# Patient Record
Sex: Female | Born: 1975 | Race: Black or African American | Hispanic: No | Marital: Single | Smoking: Never smoker
Health system: Southern US, Community
[De-identification: ages and names within clinical notes are randomized; demographics above are authoritative.]

## PROBLEM LIST (undated history)

## (undated) ENCOUNTER — Emergency Department: Admission: EM | Discharge status: Absent without leave | Payer: No Typology Code available for payment source

## (undated) DIAGNOSIS — J45909 Unspecified asthma, uncomplicated: Secondary | ICD-10-CM

## (undated) DIAGNOSIS — C801 Malignant (primary) neoplasm, unspecified: Secondary | ICD-10-CM

## (undated) HISTORY — PX: PORTACATH PLACEMENT: SHX2246

## (undated) HISTORY — PX: BREAST SURGERY: SHX581

---

## 2010-09-16 ENCOUNTER — Emergency Department (HOSPITAL_BASED_OUTPATIENT_CLINIC_OR_DEPARTMENT_OTHER)
Admission: EM | Admit: 2010-09-16 | Discharge: 2010-09-16 | Disposition: A | Discharge status: Home / Self Care | Payer: Self-pay | Attending: Emergency Medicine | Admitting: Emergency Medicine

## 2010-09-16 DIAGNOSIS — J329 Chronic sinusitis, unspecified: Secondary | ICD-10-CM | POA: Insufficient documentation

## 2010-09-16 DIAGNOSIS — J3489 Other specified disorders of nose and nasal sinuses: Secondary | ICD-10-CM | POA: Insufficient documentation

## 2011-10-17 ENCOUNTER — Emergency Department (HOSPITAL_BASED_OUTPATIENT_CLINIC_OR_DEPARTMENT_OTHER)
Admission: EM | Admit: 2011-10-17 | Discharge: 2011-10-17 | Disposition: A | Discharge status: Home / Self Care | Payer: Self-pay | Attending: Emergency Medicine | Admitting: Emergency Medicine

## 2011-10-17 ENCOUNTER — Encounter (HOSPITAL_BASED_OUTPATIENT_CLINIC_OR_DEPARTMENT_OTHER): Payer: Self-pay | Admitting: *Deleted

## 2011-10-17 DIAGNOSIS — R220 Localized swelling, mass and lump, head: Secondary | ICD-10-CM | POA: Insufficient documentation

## 2011-10-17 DIAGNOSIS — D649 Anemia, unspecified: Secondary | ICD-10-CM | POA: Insufficient documentation

## 2011-10-17 DIAGNOSIS — K0889 Other specified disorders of teeth and supporting structures: Secondary | ICD-10-CM

## 2011-10-17 DIAGNOSIS — M79609 Pain in unspecified limb: Secondary | ICD-10-CM | POA: Insufficient documentation

## 2011-10-17 DIAGNOSIS — K089 Disorder of teeth and supporting structures, unspecified: Secondary | ICD-10-CM | POA: Insufficient documentation

## 2011-10-17 LAB — DIFFERENTIAL
Basophils Absolute: 0 10*3/uL (ref 0.0–0.1)
Basophils Relative: 0 % (ref 0–1)
Monocytes Absolute: 0.3 10*3/uL (ref 0.1–1.0)
Neutro Abs: 4.8 10*3/uL (ref 1.7–7.7)

## 2011-10-17 LAB — CBC
HCT: 31.5 % — ABNORMAL LOW (ref 36.0–46.0)
MCHC: 34.6 g/dL (ref 30.0–36.0)
Platelets: 369 10*3/uL (ref 150–400)
RDW: 15 % (ref 11.5–15.5)

## 2011-10-17 MED ORDER — HYDROCODONE-ACETAMINOPHEN 5-325 MG PO TABS: 2.0000 | ORAL_TABLET | ORAL | Status: AC | PRN

## 2011-10-17 MED ORDER — FERROUS SULFATE 325 (65 FE) MG PO TABS: 325.0000 mg | ORAL_TABLET | Freq: Every day | ORAL | Status: DC

## 2011-10-17 MED ORDER — CLINDAMYCIN HCL 150 MG PO CAPS: 150.0000 mg | ORAL_CAPSULE | Freq: Four times a day (QID) | ORAL | Status: AC

## 2011-10-17 NOTE — ED Provider Notes (Signed)
History     CSN: [id number]  Arrival date & time 10/17/11  1122   First MD Initiated Contact with Patient 10/17/11 1236      Chief Complaint  Patient presents with  . Facial Pain  . Leg Pain    (Consider location/radiation/quality/duration/timing/severity/associated sxs/prior treatment) Patient is a 36 y.o. female presenting with tooth pain. The history is provided by the patient. No language interpreter was used.  Dental PainThe primary symptoms include mouth pain. The symptoms began 2 days ago. The symptoms are worsening. The symptoms are new. The symptoms occur constantly.  Additional symptoms include: gum swelling and gum tenderness.  Pt also reports cramping pain in left leg last night.   Pt reports she eats a lot of ice.  Pt has a history of anemia  History reviewed. No pertinent past medical history.  History reviewed. No pertinent past surgical history.  History reviewed. No pertinent family history.  History  Substance Use Topics  . Smoking status: Never Smoker   . Smokeless tobacco: Not on file  . Alcohol Use: No    OB History    Grav Para Term Preterm Abortions TAB SAB Ect Mult Living                  Review of Systems  HENT: Positive for dental problem.   All other systems reviewed and are negative.    Allergies  Penicillins  Home Medications  No current outpatient prescriptions on file.  BP 123/80  Pulse 70  Temp(Src) 98.7 F (37.1 C) (Oral)  Resp 18  SpO2 100%  LMP 10/03/2011  Physical Exam  Nursing note and vitals reviewed. Constitutional: She is oriented to person, place, and time. She appears well-developed and well-nourished.  HENT:  Head: Normocephalic and atraumatic.  Right Ear: External ear normal.  Left Ear: External ear normal.       Swollen left gumline  Eyes: Conjunctivae and EOM are normal. Pupils are equal, round, and reactive to light.  Neck: Normal range of motion. Neck supple.  Cardiovascular: Normal rate and normal  heart sounds.   Pulmonary/Chest: Effort normal.  Abdominal: Soft.  Musculoskeletal: Normal range of motion.  Neurological: She is alert and oriented to person, place, and time. She has normal reflexes.  Skin: Skin is warm.  Psychiatric: She has a normal mood and affect.    ED Course  Procedures (including critical care time)  Labs Reviewed - No data to display No results found.   No diagnosis found.    MDM  Schedule to see Dr. Leanord Asal for dental evaluation  Hydrocodone,  Clindamycin,  Iron supplement.   Pt referred to dr. Rodena Medin for anemia evaluation       Lonia Skinner [place], [place] 10/17/11 1414  Lonia Skinner [place], [place] 10/17/11 1415

## 2011-10-17 NOTE — ED Notes (Signed)
Reports sharp pain in face from upper lip to behind ear also having sharp pains going down left leg

## 2011-10-17 NOTE — ED Notes (Signed)
PO fluids provided. 

## 2011-10-17 NOTE — Discharge Instructions (Signed)
Dental Pain A tooth ache may be caused by cavities (tooth decay). Cavities expose the nerve of the tooth to air and hot or cold temperatures. It may come from an infection or abscess (also called a boil or furuncle) around your tooth. It is also often caused by dental caries (tooth decay). This causes the pain you are having. DIAGNOSIS  Your caregiver can diagnose this problem by exam. TREATMENT   If caused by an infection, it may be treated with medications which kill germs (antibiotics) and pain medications as prescribed by your caregiver. Take medications as directed.   Only take over-the-counter or prescription medicines for pain, discomfort, or fever as directed by your caregiver.   Whether the tooth ache today is caused by infection or dental disease, you should see your dentist as soon as possible for further care.  SEEK MEDICAL CARE IF: The exam and treatment you received today has been provided on an emergency basis only. This is not a substitute for complete medical or dental care. If your problem worsens or new problems (symptoms) appear, and you are unable to meet with your dentist, call or return to this location. SEEK IMMEDIATE MEDICAL CARE IF:   You have a fever.   You develop redness and swelling of your face, jaw, or neck.   You are unable to open your mouth.   You have severe pain uncontrolled by pain medicine.  MAKE SURE YOU:   Understand these instructions.   Will watch your condition.   Will get help right away if you are not doing well or get worse.  Document Released: 05/08/2005 Document Revised: 04/27/2011 Document Reviewed: 12/25/2007 [facility] Patient Information 2012 [place], [place].Anemia, Nonspecific Your exam and blood tests show you are anemic. This means your blood (hemoglobin) level is low. Normal hemoglobin values are 12 to 15 g/dL for females and 14 to 17 g/dL for males. Make a note of your hemoglobin level today. The hematocrit percent is also used to  measure anemia. A normal hematocrit is 38% to 46% in females and 42% to 49% in males. Make a note of your hematocrit level today. CAUSES  Anemia can be due to many different causes.  Excessive bleeding from periods (in women).   Intestinal bleeding.   Poor nutrition.   Kidney, thyroid, liver, and bone marrow diseases.  SYMPTOMS  Anemia can come on suddenly (acute). It can also come on slowly. Symptoms can include:  Minor weakness.   Dizziness.   Palpitations.   Shortness of breath.  Symptoms may be absent until half your hemoglobin is missing if it comes on slowly. Anemia due to acute blood loss from an injury or internal bleeding may require blood transfusion if the loss is severe. Hospital care is needed if you are anemic and there is significant continual blood loss. TREATMENT   Stool tests for blood (Hemoccult) and additional lab tests are often needed. This determines the best treatment.   Further checking on your condition and your response to treatment is very important. It often takes many weeks to correct anemia.  Depending on the cause, treatment can include:  Supplements of iron.   Vitamins B12 and folic acid.   Hormone medicines.If your anemia is due to bleeding, finding the cause of the blood loss is very important. This will help avoid further problems.  SEEK IMMEDIATE MEDICAL CARE IF:   You develop fainting, extreme weakness, shortness of breath, or chest pain.   You develop heavy vaginal bleeding.   You  develop bloody or black, tarry stools or vomit up blood.   You develop a high fever, rash, repeated vomiting, or dehydration.  Document Released: 06/15/2004 Document Revised: 04/27/2011 Document Reviewed: 03/23/2009 [facility] Patient Information 2012 [place], [place].

## 2011-10-18 NOTE — ED Provider Notes (Signed)
Medical screening examination/treatment/procedure(s) were performed by non-physician practitioner and as supervising physician I was immediately available for consultation/collaboration.   Brysen Shankman, MD 10/18/11 0702 

## 2012-03-27 ENCOUNTER — Encounter (HOSPITAL_BASED_OUTPATIENT_CLINIC_OR_DEPARTMENT_OTHER): Payer: Self-pay | Admitting: Family Medicine

## 2012-03-27 ENCOUNTER — Emergency Department (HOSPITAL_BASED_OUTPATIENT_CLINIC_OR_DEPARTMENT_OTHER)
Admission: EM | Admit: 2012-03-27 | Discharge: 2012-03-27 | Disposition: A | Discharge status: Home / Self Care | Payer: Self-pay | Attending: Emergency Medicine | Admitting: Emergency Medicine

## 2012-03-27 DIAGNOSIS — J45909 Unspecified asthma, uncomplicated: Secondary | ICD-10-CM | POA: Insufficient documentation

## 2012-03-27 DIAGNOSIS — K029 Dental caries, unspecified: Secondary | ICD-10-CM | POA: Insufficient documentation

## 2012-03-27 DIAGNOSIS — Z79899 Other long term (current) drug therapy: Secondary | ICD-10-CM | POA: Insufficient documentation

## 2012-03-27 HISTORY — DX: Unspecified asthma, uncomplicated: J45.909

## 2012-03-27 MED ORDER — CLINDAMYCIN HCL 150 MG PO CAPS: 150.0000 mg | ORAL_CAPSULE | Freq: Four times a day (QID) | ORAL | Status: DC

## 2012-03-27 MED ORDER — IBUPROFEN 800 MG PO TABS
800.0000 mg | ORAL_TABLET | Freq: Once | ORAL | Status: AC
  Administered 2012-03-27: 800 mg via ORAL
  Filled 2012-03-27: qty 1

## 2012-03-27 NOTE — ED Notes (Signed)
Pt c/o left upper tooth pain x 2 wks and c/o left sided facial swelling since yesterday. Pt denies fever, chills

## 2012-03-27 NOTE — ED Provider Notes (Signed)
History     CSN: [id number]  Arrival date & time 03/27/12  4098   First MD Initiated Contact with Patient 03/27/12 1010      Chief Complaint  Patient presents with  . Dental Pain    (Consider location/radiation/quality/duration/timing/severity/associated sxs/prior treatment) HPI Patient complaining of swelling in left cheek. She has noticed some left upper tooth pain for 2 weeks but states it only is painful when she opened her mouth very wide. She has had some problems with her teeth in the past. She tried to make an appointment with the dentist today but cannot be seen for 2 weeks. She has not had any fever or chills. She has not had any new injury to her teeth. She takes Aleve with relief of pain. Past Medical History  Diagnosis Date  . Asthma     Past Surgical History  Procedure Date  . Cesarean section     No family history on file.  History  Substance Use Topics  . Smoking status: Never Smoker   . Smokeless tobacco: Not on file  . Alcohol Use: No    OB History    Grav Para Term Preterm Abortions TAB SAB Ect Mult Living                  Review of Systems  Constitutional: Negative.   HENT: Positive for facial swelling.   Respiratory: Negative.   Cardiovascular: Negative.   Genitourinary: Negative.     Allergies  Penicillins  Home Medications   Current Outpatient Rx  Name  Route  Sig  Dispense  Refill  . NAPROXEN SODIUM 220 MG PO TABS   Oral   Take 220 mg by mouth 2 (two) times daily with a meal.         . FERROUS SULFATE 325 (65 FE) MG PO TABS   Oral   Take 1 tablet (325 mg total) by mouth daily.   30 tablet   0     BP 102/70  Pulse 81  Temp 99.3 F (37.4 C) (Oral)  Resp 18  Ht 5\' 2"  (1.575 m)  Wt 125 lb (56.7 kg)  BMI 22.86 kg/m2  SpO2 99%  LMP 03/12/2012  Physical Exam  Nursing note and vitals reviewed. Constitutional: She is oriented to person, place, and time. She appears well-developed and well-nourished.  HENT:  Head:  Normocephalic and atraumatic.  Right Ear: External ear normal.  Left Ear: External ear normal.  Mouth/Throat: Oropharynx is clear and moist.       Dental caries left upper second molar with no tenderness around the gum or swelling of the gum noted.  Eyes: Pupils are equal, round, and reactive to light.  Neck: Normal range of motion.  Cardiovascular: Normal rate.   Pulmonary/Chest: Effort normal.  Abdominal: Soft.  Musculoskeletal: Normal range of motion.  Neurological: She is alert and oriented to person, place, and time.  Skin: Skin is warm and dry.  Psychiatric: She has a normal mood and affect.    ED Course  Procedures (including critical care time)  Labs Reviewed - No data to display No results found.   No diagnosis found.    MDM  Patient is allergic to penicillin and will be placed on clindamycin. She'll continue to use nonsteroidals for pain. She's advised followup with a dentist as soon as possible.        Hilario Quarry, MD 03/27/12 1016

## 2012-06-23 ENCOUNTER — Encounter (HOSPITAL_BASED_OUTPATIENT_CLINIC_OR_DEPARTMENT_OTHER): Payer: Self-pay | Admitting: *Deleted

## 2012-06-23 ENCOUNTER — Emergency Department (HOSPITAL_BASED_OUTPATIENT_CLINIC_OR_DEPARTMENT_OTHER)
Admission: EM | Admit: 2012-06-23 | Discharge: 2012-06-23 | Disposition: A | Discharge status: Home / Self Care | Payer: Self-pay | Attending: Emergency Medicine | Admitting: Emergency Medicine

## 2012-06-23 DIAGNOSIS — R509 Fever, unspecified: Secondary | ICD-10-CM | POA: Insufficient documentation

## 2012-06-23 DIAGNOSIS — J45909 Unspecified asthma, uncomplicated: Secondary | ICD-10-CM | POA: Insufficient documentation

## 2012-06-23 DIAGNOSIS — L03317 Cellulitis of buttock: Secondary | ICD-10-CM

## 2012-06-23 DIAGNOSIS — L0231 Cutaneous abscess of buttock: Secondary | ICD-10-CM | POA: Insufficient documentation

## 2012-06-23 DIAGNOSIS — Z79899 Other long term (current) drug therapy: Secondary | ICD-10-CM | POA: Insufficient documentation

## 2012-06-23 DIAGNOSIS — Z3202 Encounter for pregnancy test, result negative: Secondary | ICD-10-CM | POA: Insufficient documentation

## 2012-06-23 LAB — URINALYSIS, ROUTINE W REFLEX MICROSCOPIC
Bilirubin Urine: NEGATIVE
Ketones, ur: NEGATIVE mg/dL
Nitrite: NEGATIVE
Specific Gravity, Urine: 1.004 — ABNORMAL LOW (ref 1.005–1.030)
Urobilinogen, UA: 1 mg/dL (ref 0.0–1.0)

## 2012-06-23 LAB — URINE MICROSCOPIC-ADD ON

## 2012-06-23 MED ORDER — SULFAMETHOXAZOLE-TRIMETHOPRIM 800-160 MG PO TABS: 1.0000 | ORAL_TABLET | Freq: Two times a day (BID) | ORAL | Status: DC

## 2012-06-23 MED ORDER — CEPHALEXIN 250 MG PO CAPS
500.0000 mg | ORAL_CAPSULE | Freq: Once | ORAL | Status: AC
  Administered 2012-06-23: 500 mg via ORAL
  Filled 2012-06-23: qty 2

## 2012-06-23 MED ORDER — SULFAMETHOXAZOLE-TMP DS 800-160 MG PO TABS
1.0000 | ORAL_TABLET | Freq: Once | ORAL | Status: AC
  Administered 2012-06-23: 1 via ORAL
  Filled 2012-06-23: qty 1

## 2012-06-23 MED ORDER — CEPHALEXIN 500 MG PO CAPS: 500.0000 mg | ORAL_CAPSULE | Freq: Two times a day (BID) | ORAL | Status: DC

## 2012-06-23 MED ORDER — HYDROCODONE-ACETAMINOPHEN 5-500 MG PO TABS: 1.0000 | ORAL_TABLET | Freq: Four times a day (QID) | ORAL | Status: DC | PRN

## 2012-06-23 MED ORDER — IBUPROFEN 800 MG PO TABS
800.0000 mg | ORAL_TABLET | Freq: Once | ORAL | Status: AC
  Administered 2012-06-23: 800 mg via ORAL
  Filled 2012-06-23: qty 1

## 2012-06-23 NOTE — ED Notes (Signed)
Pt states she has a boil on her right buttocks which she noticed yesterday. Also c/o fever today.

## 2012-06-23 NOTE — ED Provider Notes (Signed)
History     CSN: [id number]  Arrival date & time 06/23/12  1943   First MD Initiated Contact with Patient 06/23/12 1946      Chief Complaint  Patient presents with  . Abscess    (Consider location/radiation/quality/duration/timing/severity/associated sxs/prior treatment) HPI Shelly Webb is a 37 y.o. female who presents to ED complaining of an abscess to right buttocks and fever. States noted abscess yesterday. Has tried to soak it, applied salt on it. No relief. States today noted fever, chills. No URI symptoms. No urinary symptoms. No abdominal pain. States "i dont really feel bad" but worried because called pharmacy and they told her she may have severe infection that could get into her blood stream. Pt took tylenol cold and flu prior to coming in.    Past Medical History  Diagnosis Date  . Asthma     Past Surgical History  Procedure Date  . Cesarean section     History reviewed. No pertinent family history.  History  Substance Use Topics  . Smoking status: Never Smoker   . Smokeless tobacco: Not on file  . Alcohol Use: No    OB History    Grav Para Term Preterm Abortions TAB SAB Ect Mult Living                  Review of Systems  Constitutional: Positive for fever and chills.  Genitourinary: Negative for dysuria and flank pain.  Skin: Positive for wound.  All other systems reviewed and are negative.    Allergies  Penicillins  Home Medications   Current Outpatient Rx  Name  Route  Sig  Dispense  Refill  . CLINDAMYCIN HCL 150 MG PO CAPS   Oral   Take 1 capsule (150 mg total) by mouth every 6 (six) hours.   28 capsule   0   . FERROUS SULFATE 325 (65 FE) MG PO TABS   Oral   Take 1 tablet (325 mg total) by mouth daily.   30 tablet   0   . NAPROXEN SODIUM 220 MG PO TABS   Oral   Take 220 mg by mouth 2 (two) times daily with a meal.           BP 123/67  Pulse 111  Temp 101.6 F (38.7 C) (Oral)  Resp 20  Ht 5\' 2"  (1.575 m)  Wt 127 lb  (57.607 kg)  BMI 23.23 kg/m2  SpO2 98%  LMP 05/23/2012  Physical Exam  Nursing note and vitals reviewed. Constitutional: She appears well-developed and well-nourished. No distress.  Eyes: Conjunctivae normal are normal.  Cardiovascular: Normal rate, regular rhythm and normal heart sounds.   Pulmonary/Chest: Effort normal and breath sounds normal. No respiratory distress. She has no wheezes. She has no rales.  Skin: Skin is warm and dry.       Abscess to the left buttock, about 3x3cm. Erythematous. Tender. Indurated. Surrounding erythema, celluitis.     ED Course  Procedures (including critical care time)  INCISION AND DRAINAGE Performed by: Jaynie Crumble A Consent: Verbal consent obtained. Risks and benefits: risks, benefits and alternatives were discussed Type: abscess  Body area: right buttock  Anesthesia: local infiltration  Incision was made with a scalpel.  Local anesthetic: lidocaine 2% wo epinephrine  Anesthetic total: 3 ml  Complexity: complex Blunt dissection to break up loculations  Drainage: purulent  Drainage amount: moderate  Packing material: 1/4 in iodoform gauze  Patient tolerance: Patient tolerated the procedure well with no immediate  complications.      1. Abscess of right buttock   2. Cellulitis of right buttock       MDM  Pt with fever, right buttock abscess and cellulitis. She is non toxic appearing. No signs of systemic illness. Abscess I&Ded. Wills tart on antibiotic, follow up in 48hrs for recheck or sooner if worsening. VS improved with motrin here. Temp down to 98.9, HR down to 82.     Filed Vitals:   06/23/12 1947 06/23/12 1950 06/23/12 2028 06/23/12 2118  BP: 123/67   109/63  Pulse: 111   82  Temp: 98.8 F (37.1 C) 101.6 F (38.7 C) 101.7 F (38.7 C) 98.9 F (37.2 C)  TempSrc: Oral Oral Oral Oral  Resp: 20   18  Height: 5\' 2"  (1.575 m)     Weight: 127 lb (57.607 kg)     SpO2: 98%   98%       Lottie Mussel, PA 06/23/12 2124

## 2012-06-23 NOTE — ED Provider Notes (Signed)
Medical screening examination/treatment/procedure(s) were performed by non-physician practitioner and as supervising physician I was immediately available for consultation/collaboration.  Shelda Jakes, MD 06/23/12 [phone]

## 2012-06-23 NOTE — ED Notes (Signed)
Patient stated first temp we got was not correct, asked for re-take. Second temp was 101.6 Patient states temp at home was over 101 and that she took tylenol before came to ED.

## 2012-06-25 ENCOUNTER — Encounter (HOSPITAL_BASED_OUTPATIENT_CLINIC_OR_DEPARTMENT_OTHER): Payer: Self-pay

## 2012-06-25 ENCOUNTER — Emergency Department (HOSPITAL_BASED_OUTPATIENT_CLINIC_OR_DEPARTMENT_OTHER)
Admission: EM | Admit: 2012-06-25 | Discharge: 2012-06-25 | Disposition: A | Discharge status: Home / Self Care | Payer: Self-pay | Attending: Emergency Medicine | Admitting: Emergency Medicine

## 2012-06-25 DIAGNOSIS — Z79899 Other long term (current) drug therapy: Secondary | ICD-10-CM | POA: Insufficient documentation

## 2012-06-25 DIAGNOSIS — L0231 Cutaneous abscess of buttock: Secondary | ICD-10-CM | POA: Insufficient documentation

## 2012-06-25 DIAGNOSIS — L0291 Cutaneous abscess, unspecified: Secondary | ICD-10-CM

## 2012-06-25 DIAGNOSIS — J45909 Unspecified asthma, uncomplicated: Secondary | ICD-10-CM | POA: Insufficient documentation

## 2012-06-25 NOTE — ED Provider Notes (Signed)
History/physical exam/procedure(s) were performed by non-physician practitioner and as supervising physician I was immediately available for consultation/collaboration. I have reviewed all notes and am in agreement with care and plan.   Hilario Quarry, MD 06/25/12 1540

## 2012-06-25 NOTE — ED Notes (Signed)
Wound recheck on right buttock.  Seen here Sunday and had an I&D.

## 2012-06-25 NOTE — ED Provider Notes (Signed)
History     CSN: [id number]  Arrival date & time 06/25/12  1149   First MD Initiated Contact with Patient 06/25/12 1213      Chief Complaint  Patient presents with  . Wound Check    (Consider location/radiation/quality/duration/timing/severity/associated sxs/prior treatment) Patient is a 37 y.o. female presenting with wound check. The history is provided by the patient.  Wound Check  She was treated in the ED 2 to 3 days ago. Previous treatment in the ED includes I&D of abscess. There has been no treatment since the wound repair. The fever has been present for less than 1 day. Her temperature was unmeasured prior to arrival. There has been colored discharge from the wound. There is no redness present. There is no swelling present. The pain has no pain. She has no difficulty moving the affected extremity or digit.    Past Medical History  Diagnosis Date  . Asthma     Past Surgical History  Procedure Date  . Cesarean section     No family history on file.  History  Substance Use Topics  . Smoking status: Never Smoker   . Smokeless tobacco: Not on file  . Alcohol Use: No    OB History    Grav Para Term Preterm Abortions TAB SAB Ect Mult Living                  Review of Systems  Skin: Positive for wound.  All other systems reviewed and are negative.    Allergies  Penicillins  Home Medications   Current Outpatient Rx  Name  Route  Sig  Dispense  Refill  . CEPHALEXIN 500 MG PO CAPS   Oral   Take 1 capsule (500 mg total) by mouth 2 (two) times daily.   14 capsule   0   . HYDROCODONE-ACETAMINOPHEN 5-500 MG PO TABS   Oral   Take 1-2 tablets by mouth every 6 (six) hours as needed for pain.   15 tablet   0   . SULFAMETHOXAZOLE-TRIMETHOPRIM 800-160 MG PO TABS   Oral   Take 1 tablet by mouth every 12 (twelve) hours.   14 tablet   0     BP 127/81  Pulse 92  Temp 98.9 F (37.2 C) (Oral)  Resp 20  SpO2 100%  LMP 05/23/2012  Physical Exam  Nursing  note and vitals reviewed. Constitutional: She appears well-developed and well-nourished.  HENT:  Head: Normocephalic.  Neck: Normal range of motion.  Pulmonary/Chest: Effort normal.  Neurological: She is alert.  Skin: Skin is warm.       Healing wound left buttock,   Packing is gone,   Slight oozing  Psychiatric: She has a normal mood and affect.    ED Course  Procedures (including critical care time)  Labs Reviewed - No data to display No results found.   1. Abscess       MDM          Elson Areas, PA 06/25/12 1329

## 2013-09-29 ENCOUNTER — Emergency Department (HOSPITAL_BASED_OUTPATIENT_CLINIC_OR_DEPARTMENT_OTHER)
Admission: EM | Admit: 2013-09-29 | Discharge: 2013-09-29 | Disposition: A | Discharge status: Home / Self Care | Payer: Self-pay | Attending: Emergency Medicine | Admitting: Emergency Medicine

## 2013-09-29 ENCOUNTER — Emergency Department (HOSPITAL_BASED_OUTPATIENT_CLINIC_OR_DEPARTMENT_OTHER): Payer: Self-pay

## 2013-09-29 ENCOUNTER — Encounter (HOSPITAL_BASED_OUTPATIENT_CLINIC_OR_DEPARTMENT_OTHER): Payer: Self-pay | Admitting: Emergency Medicine

## 2013-09-29 DIAGNOSIS — Z880 Allergy status to penicillin: Secondary | ICD-10-CM | POA: Insufficient documentation

## 2013-09-29 DIAGNOSIS — J45909 Unspecified asthma, uncomplicated: Secondary | ICD-10-CM | POA: Insufficient documentation

## 2013-09-29 DIAGNOSIS — B9789 Other viral agents as the cause of diseases classified elsewhere: Secondary | ICD-10-CM | POA: Insufficient documentation

## 2013-09-29 DIAGNOSIS — B349 Viral infection, unspecified: Secondary | ICD-10-CM

## 2013-09-29 NOTE — ED Provider Notes (Signed)
CSN: [id number]     Arrival date & time 09/29/13  1308 History   First MD Initiated Contact with Patient 09/29/13 [phone]     Chief Complaint  Patient presents with  . Fever     (Consider location/radiation/quality/duration/timing/severity/associated sxs/prior Treatment) Patient is a 38 y.o. female presenting with cough. The history is provided by the patient. No language interpreter was used.  Cough Cough characteristics:  Productive Severity:  Moderate Onset quality:  Gradual Duration:  4 days Timing:  Constant Progression:  Worsening Chronicity:  New Smoker: no   Relieved by:  Nothing Worsened by:  Activity Ineffective treatments:  Beta-agonist inhaler Associated symptoms: fever     Past Medical History  Diagnosis Date  . Asthma    Past Surgical History  Procedure Laterality Date  . Cesarean section     History reviewed. No pertinent family history. History  Substance Use Topics  . Smoking status: Never Smoker   . Smokeless tobacco: Not on file  . Alcohol Use: No   OB History   Grav Para Term Preterm Abortions TAB SAB Ect Mult Living                 Review of Systems  Constitutional: Positive for fever.  Respiratory: Positive for cough.   All other systems reviewed and are negative.     Allergies  Penicillins  Home Medications   Prior to Admission medications   Not on File   BP 115/75  Pulse 90  Temp(Src) 98.7 F (37.1 C) (Oral)  Resp 16  Ht 5\' 1"  (1.549 m)  Wt 127 lb (57.607 kg)  BMI 24.01 kg/m2  SpO2 100%  LMP 09/29/2013 Physical Exam  Nursing note and vitals reviewed. Constitutional: She is oriented to person, place, and time. She appears well-developed and well-nourished.  HENT:  Head: Normocephalic.  Right Ear: External ear normal.  Left Ear: External ear normal.  Eyes: EOM are normal. Pupils are equal, round, and reactive to light.  Neck: Normal range of motion.  Cardiovascular: Normal rate and normal heart sounds.    Pulmonary/Chest: Effort normal and breath sounds normal.  Abdominal: Soft. She exhibits no distension.  Musculoskeletal: Normal range of motion.  Neurological: She is alert and oriented to person, place, and time.  Skin: Skin is warm.  Psychiatric: She has a normal mood and affect.    ED Course  Procedures (including critical care time) Labs Review Labs Reviewed - No data to display  Imaging Review Dg Chest 2 View  09/29/2013   CLINICAL DATA:  Fever.  EXAM: CHEST  2 VIEW  COMPARISON:  None.  FINDINGS: The heart size and mediastinal contours are within normal limits. Both lungs are clear. No pneumothorax or pleural effusion is noted. The visualized skeletal structures are unremarkable.  IMPRESSION: No acute cardiopulmonary abnormality seen.   Electronically Signed   By: Sabino Dick M.D.   On: 09/29/2013 09:46     EKG Interpretation None      MDM   Final diagnoses:  Viral illness    Pt advised tylenol,  Increase fluids    Fransico Meadow, PA-C 09/29/13 1339

## 2013-09-29 NOTE — ED Notes (Signed)
Pt reports fever of 102 this morning.  Denies symptoms.  Denies body aches.  Reports 'allergies' since Thursday.  pts son was recently dx with the flu.

## 2013-09-29 NOTE — ED Provider Notes (Signed)
Medical screening examination/treatment/procedure(s) were performed by non-physician practitioner and as supervising physician I was immediately available for consultation/collaboration.   EKG Interpretation None        Blanchie Dessert, MD 09/29/13 1513

## 2013-09-29 NOTE — Discharge Instructions (Signed)
Viral Infections °A virus is a type of germ. Viruses can cause: °· Minor sore throats. °· Aches and pains. °· Headaches. °· Runny nose. °· Rashes. °· Watery eyes. °· Tiredness. °· Coughs. °· Loss of appetite. °· Feeling sick to your stomach (nausea). °· Throwing up (vomiting). °· Watery poop (diarrhea). °HOME CARE  °· Only take medicines as told by your doctor. °· Drink enough water and fluids to keep your pee (urine) clear or pale yellow. Sports drinks are a good choice. °· Get plenty of rest and eat healthy. Soups and broths with crackers or rice are fine. °GET HELP RIGHT AWAY IF:  °· You have a very bad headache. °· You have shortness of breath. °· You have chest pain or neck pain. °· You have an unusual rash. °· You cannot stop throwing up. °· You have watery poop that does not stop. °· You cannot keep fluids down. °· You or your child has a temperature by mouth above 102° F (38.9° C), not controlled by medicine. °· Your baby is older than 3 months with a rectal temperature of 102° F (38.9° C) or higher. °· Your baby is 3 months old or younger with a rectal temperature of 100.4° F (38° C) or higher. °MAKE SURE YOU:  °· Understand these instructions. °· Will watch this condition. °· Will get help right away if you are not doing well or get worse. °Document Released: 04/20/2008 Document Revised: 07/31/2011 Document Reviewed: 09/13/2010 °[employer]® Patient Information ©2014 [employer]. ° °

## 2015-02-04 ENCOUNTER — Encounter (HOSPITAL_BASED_OUTPATIENT_CLINIC_OR_DEPARTMENT_OTHER): Payer: Self-pay | Admitting: Emergency Medicine

## 2015-02-04 ENCOUNTER — Other Ambulatory Visit: Payer: Self-pay

## 2015-02-04 ENCOUNTER — Emergency Department (HOSPITAL_BASED_OUTPATIENT_CLINIC_OR_DEPARTMENT_OTHER)
Admission: EM | Admit: 2015-02-04 | Discharge: 2015-02-04 | Disposition: A | Discharge status: Home / Self Care | Payer: Self-pay | Attending: Emergency Medicine | Admitting: Emergency Medicine

## 2015-02-04 DIAGNOSIS — R143 Flatulence: Secondary | ICD-10-CM | POA: Insufficient documentation

## 2015-02-04 DIAGNOSIS — J45909 Unspecified asthma, uncomplicated: Secondary | ICD-10-CM | POA: Insufficient documentation

## 2015-02-04 DIAGNOSIS — Z79899 Other long term (current) drug therapy: Secondary | ICD-10-CM | POA: Insufficient documentation

## 2015-02-04 DIAGNOSIS — Z880 Allergy status to penicillin: Secondary | ICD-10-CM | POA: Insufficient documentation

## 2015-02-04 LAB — CBC WITH DIFFERENTIAL/PLATELET
BASOS PCT: 0 %
Basophils Absolute: 0 10*3/uL (ref 0.0–0.1)
EOS ABS: 0.1 10*3/uL (ref 0.0–0.7)
Eosinophils Relative: 1 %
HEMATOCRIT: 31.4 % — AB (ref 36.0–46.0)
Hemoglobin: 10.2 g/dL — ABNORMAL LOW (ref 12.0–15.0)
Lymphocytes Relative: 21 %
Lymphs Abs: 1.4 10*3/uL (ref 0.7–4.0)
MCH: 26.3 pg (ref 26.0–34.0)
MCHC: 32.5 g/dL (ref 30.0–36.0)
MCV: 80.9 fL (ref 78.0–100.0)
MONO ABS: 0.3 10*3/uL (ref 0.1–1.0)
MONOS PCT: 4 %
Neutro Abs: 4.7 10*3/uL (ref 1.7–7.7)
Neutrophils Relative %: 74 %
Platelets: 376 10*3/uL (ref 150–400)
RBC: 3.88 MIL/uL (ref 3.87–5.11)
RDW: 16.3 % — AB (ref 11.5–15.5)
WBC: 6.4 10*3/uL (ref 4.0–10.5)

## 2015-02-04 LAB — COMPREHENSIVE METABOLIC PANEL
ALBUMIN: 4.3 g/dL (ref 3.5–5.0)
ALT: 14 U/L (ref 14–54)
ANION GAP: 9 (ref 5–15)
AST: 21 U/L (ref 15–41)
Alkaline Phosphatase: 54 U/L (ref 38–126)
BILIRUBIN TOTAL: 0.7 mg/dL (ref 0.3–1.2)
BUN: 6 mg/dL (ref 6–20)
CO2: 25 mmol/L (ref 22–32)
Calcium: 9.1 mg/dL (ref 8.9–10.3)
Chloride: 105 mmol/L (ref 101–111)
Creatinine, Ser: 0.71 mg/dL (ref 0.44–1.00)
GFR calc Af Amer: >60 mL/min (ref >60)
GFR calc non Af Amer: >60 mL/min (ref >60)
GLUCOSE: 98 mg/dL (ref 65–99)
POTASSIUM: 3.4 mmol/L — AB (ref 3.5–5.1)
SODIUM: 139 mmol/L (ref 135–145)
TOTAL PROTEIN: 7.5 g/dL (ref 6.5–8.1)

## 2015-02-04 LAB — TROPONIN I: Troponin I: <0.03 ng/mL (ref <0.031)

## 2015-02-04 MED ORDER — PANTOPRAZOLE SODIUM 20 MG PO TBEC: 20.0000 mg | DELAYED_RELEASE_TABLET | Freq: Every day | ORAL | Status: AC

## 2015-02-04 NOTE — ED Provider Notes (Signed)
CSN: [id number]     Arrival date & time 02/04/15  8250 History   First MD Initiated Contact with Patient 02/04/15 [phone]     Chief Complaint  Patient presents with  . Gastrophageal Reflux     (Consider location/radiation/quality/duration/timing/severity/associated sxs/prior Treatment) Patient is a 39 y.o. female presenting with chest pain.  Chest Pain Pain location:  L chest and L lateral chest Pain quality: burning   Pain radiates to:  Does not radiate Pain radiates to the back: no   Pain severity:  Mild Progression:  Resolved Chronicity:  Recurrent Context: not breathing, no drug use and not eating     Past Medical History  Diagnosis Date  . Asthma    Past Surgical History  Procedure Laterality Date  . Cesarean section     No family history on file. Social History  Substance Use Topics  . Smoking status: Never Smoker   . Smokeless tobacco: None  . Alcohol Use: No   OB History    No data available     Review of Systems  Cardiovascular: Positive for chest pain.  All other systems reviewed and are negative.     Allergies  Penicillins  Home Medications   Prior to Admission medications   Medication Sig Start Date End Date Taking? Authorizing Provider  calcium carbonate (TUMS - DOSED IN MG ELEMENTAL CALCIUM) 500 MG chewable tablet Chew 1 tablet by mouth daily.   Yes Historical Provider, MD  ranitidine (ZANTAC) 150 MG tablet Take 150 mg by mouth 2 (two) times daily.   Yes Historical Provider, MD  pantoprazole (PROTONIX) 20 MG tablet Take 1 tablet (20 mg total) by mouth daily. 02/04/15   Merrily Pew, MD   BP 114/71 mmHg  Pulse 61  Temp(Src) 98.2 F (36.8 C) (Oral)  Resp 18  Ht 5\' 2"  (1.575 m)  Wt 138 lb (62.596 kg)  BMI 25.23 kg/m2  SpO2 100%  LMP 02/04/2015 (Exact Date) Physical Exam  Constitutional: She is oriented to person, place, and time. She appears well-developed and well-nourished.  HENT:  Head: Normocephalic and atraumatic.  Eyes:  Conjunctivae and EOM are normal. Right eye exhibits no discharge. Left eye exhibits no discharge.  Cardiovascular: Normal rate and regular rhythm.   Pulmonary/Chest: Effort normal and breath sounds normal. No respiratory distress.  Abdominal: Soft. She exhibits no distension. There is no tenderness. There is no rebound.  Musculoskeletal: Normal range of motion. She exhibits no edema or tenderness.  Neurological: She is alert and oriented to person, place, and time.  Skin: Skin is warm and dry.  Nursing note and vitals reviewed.   ED Course  Procedures (including critical care time) Labs Review Labs Reviewed  CBC WITH DIFFERENTIAL/PLATELET - Abnormal; Notable for the following:    Hemoglobin 10.2 (*)    HCT 31.4 (*)    RDW 16.3 (*)    All other components within normal limits  COMPREHENSIVE METABOLIC PANEL - Abnormal; Notable for the following:    Potassium 3.4 (*)    All other components within normal limits  TROPONIN I    Imaging Review No results found. I have personally reviewed and evaluated these images and lab results as part of my medical decision-making.   EKG Interpretation   Date/Time:  Thursday February 04 2015 08:38:18 EDT Ventricular Rate:  74 PR Interval:  130 QRS Duration: 72 QT Interval:  356 QTC Calculation: 395 R Axis:   79 Text Interpretation:  Normal sinus rhythm Nonspecific ST abnormality  Abnormal ECG  Confirmed by [facility] MD, Corene Cornea [phone]) on 02/04/2015 9:11:01 AM      MDM   Final diagnoses:  Flatulence   39 year old female who is symptom-free this time. However had chest pain couple days ago associated with lots of burping and flatulence. Feels similar to previous episodes of reflux but lasted longer. Exam benign as above. Symptoms may be related to reflux. As she is not having pain right now I checked one troponin to make sure it wasn't elevated from ischemia she had previously.  I have personally and contemperaneously reviewed labs and  imaging and used in my decision making as above.   A medical screening exam was performed and I feel the patient has had an appropriate workup for their chief complaint at this time and likelihood of emergent condition existing is low. They have been counseled on decision, discharge, follow up and which symptoms necessitate immediate return to the emergency department. They or their family verbally stated understanding and agreement with plan and discharged in stable condition.      Merrily Pew, MD 02/04/15 [phone]

## 2015-02-04 NOTE — ED Notes (Signed)
Pt reports Monday night developed a sharp pain under left breast. Burping and passed gas as days went on, took Tums and Zantac. Today states she does not feel anything but wants to "ease my mind, I am a worry box and research a lot on the Internet." Pt NAD at triage ambulatory. Vitals stable.

## 2015-02-04 NOTE — ED Notes (Signed)
EKG performed at triage. Vitals started.

## 2015-02-04 NOTE — ED Notes (Signed)
MD at bedside. 

## 2015-02-04 NOTE — ED Notes (Signed)
RT at bedside for Lab draw.

## 2015-02-04 NOTE — Discharge Instructions (Signed)
°Emergency Department Resource Guide °1) Find a Doctor and Pay Out of Pocket °Although you won't have to find out who is covered by your insurance plan, it is a good idea to ask around and get recommendations. You will then need to call the office and see if the doctor you have chosen will accept you as a new patient and what types of options they offer for patients who are self-pay. Some doctors offer discounts or will set up payment plans for their patients who do not have insurance, but you will need to ask so you aren't surprised when you get to your appointment. ° °2) Contact Your Local Health Department °Not all health departments have doctors that can see patients for sick visits, but many do, so it is worth a call to see if yours does. If you don't know where your local health department is, you can check in your phone book. The CDC also has a tool to help you locate your state's health department, and many state websites also have listings of all of their local health departments. ° °3) Find a Walk-in Clinic °If your illness is not likely to be very severe or complicated, you may want to try a walk in clinic. These are popping up all over the country in pharmacies, drugstores, and shopping centers. They're usually staffed by nurse practitioners or physician assistants that have been trained to treat common illnesses and complaints. They're usually fairly quick and inexpensive. However, if you have serious medical issues or chronic medical problems, these are probably not your best option. ° °No Primary Care Doctor: °- Call [facility] at  832-8000 - they can help you locate a primary care doctor that  accepts your insurance, provides certain services, etc. °- Physician Referral Service- 1-800-533-3463 ° °Chronic Pain Problems: °Organization         Address  Phone   Notes  °[facility]  (336) 297-2271 Patients need to be referred by their primary care doctor.  ° °Medication  Assistance: °Organization         Address  Phone   Notes  °[place] [facility] [address] °[place], [place] [address] (336) 641-8030 --Must be a resident of [place] °-- Must have NO insurance coverage whatsoever (no Medicaid/ Medicare, etc.) °-- The pt. MUST have a primary care doctor that directs their care regularly and follows them in the community °  °[facility]  (866) 331-1348   °[employer]  (888) 892-1162   ° °Agencies that provide inexpensive medical care: °Organization         Address  Phone   Notes  °[facility]  (336) 832-8035   °[facility]    (336) 832-7272   °[facility] [address] °[place], [place] [address] (336) 832-4777   °[facility] [address], °[place] (336) 271-4999   °[facility]    (336) 373-0678   °[facility]    (336) 272-1050   °[facility] ° [address], [place] Phone:  (336) 832-4444, Fax:  (336) 832-4440 Hours of Operation:  9 am - 6 pm, M-F.  Also accepts Medicaid/Medicare and self-pay.  °[facility] ° [address], [place] Phone: (336) 832-3150, Fax: (336) 832-3151. Hours of Operation:  8:30 am - 5:30 pm, M-F.  Also accepts Medicaid and self-pay.  °[facility] [place] [address], [place] Phone: (336) 878-6027   °[facility] [address], [place], [place] (336)723-1848, Ext. 123 Mondays & Thursdays: 7-9 AM.  First 15 patients are seen on a first come, first serve basis. °  ° °Medicaid-accepting [place] Providers: ° °Organization         Address  Phone   Notes  °[facility] [address], [place] (336) 641-2100 Also accepts self-pay patients.  °[facility] [address], [place] ° (336) 856-9996   °[facility] [address], [place]  (336) 288-8857   °[facility] [address] [place] [address], [place] (336) 299-7000   °Veita Bland [address], [place]  ° (336) 373-1557 Only accepts [place] Access Medicaid patients after they have their name applied to their card.  ° °Self-Pay (no insurance) in [place]: ° °Organization         Address  Phone   Notes  °Sickle Cell Patients, [facility] [address], [place] (336) 832-1970   °[facility] [address], [place] (336) 832-4400   °[facility] [place] ° [address], [place] (336) 992-4800   °[facility]/Dr. Osei-Bonsu ° [address] [place] [address], [place] or [address], [place] (336) 841-8500 Phone number for both [place] and [place] locations is the same.  °[facility] [address], [place] (336) 299-0000   °[facility] [address] [place] [address], [place] or [address] (336) 852-7530 °(336) 878-2260   °[facility] [address], [place] (336) 350-1642, phone; (336) 294-5005, fax Sees patients 1st and 3rd Saturday of every month.  Must not qualify for public or private insurance (i.e. Medicaid, Medicare, [place] Health Choice, Veterans' Benefits) • Household income should be no more than 200% of the poverty level •The clinic cannot treat you if you are pregnant or think you are pregnant • Sexually transmitted diseases are not treated at the clinic.  ° ° °Dental Care: °Organization         Address  Phone  Notes  °[place] [facility] [address], [place] (336) 641-6152 Accepts children up to age 21 who are enrolled in Medicaid or [place] Health Choice; pregnant women with a Medicaid card; and children who have applied for Medicaid or [place] Health Choice, but were declined, whose parents can pay a reduced fee at time of service.  °[place]  [facility] [place]  [address], [place] (336) 641-7733 Accepts children up to age 21 who are enrolled in Medicaid or [place] Health Choice; pregnant women with a Medicaid card; and children who have applied for Medicaid or [place] Health Choice, but were declined, whose parents can pay a reduced fee at time of service.  °[facility] ° [address], [place] (336) 641-4533 Patients are seen by appointment only. Walk-ins are not accepted. [facility] will see patients 18 years of age and older. °Monday - Tuesday (8am-5pm) °Most Wednesdays (8:30-5pm) °$30 per visit, cash only  °[facility] ° [address], [place] (336) 641-4533 Patients are seen by appointment only. Walk-ins are not accepted. [facility] will see patients 18 years of age and older. °One   Wednesday Evening (Monthly: Volunteer Based).  $30 per visit, cash only  °[facility]  (919) 537-3737 for adults; Children under age 4, call Graduate Pediatric Dentistry at (919) [phone]. Children aged 4-14, please call (919) [phone] to request a pediatric application. ° Dental services are provided in all areas of dental care including fillings, crowns and bridges, complete and partial dentures, implants, gum treatment, root canals, and extractions. Preventive care is also provided. Treatment is provided to both adults and children. °Patients are selected via a lottery and there is often a waiting list. °  °[facility] [address], °[place] ° (336) 763-8833 [email or web address] °  °[facility] [address], [place], [place] (336)723-1848, Ext. 123 Second and Fourth Thursday of each month, opens at 6:30 AM; Clinic ends at 9 AM.  Patients are seen on a first-come first-served basis, and a limited number are seen during each clinic.  ° °[facility] ° [address], [place], [place] (336) 723-7904    Eligibility Requirements °You must have lived in [place], [place], or [place] counties for at least the last three months. °  You cannot be eligible for state or federal sponsored healthcare insurance, including Veterans Administration, Medicaid, or Medicare. °  You generally cannot be eligible for healthcare insurance through your employer.  °  How to apply: °Eligibility screenings are held every Tuesday and Wednesday afternoon from 1:00 pm until 4:00 pm. You do not need an appointment for the interview!  °[facility] [address], [place], [place] 336-631-2330   °[facility]  336-342-8273   °[facility]  336-703-3100   °[facility]  336-570-6415   ° °Behavioral Health Resources in the Community: °Intensive Outpatient Programs °Organization         Address  Phone  Notes  °[facility] [address], [place], [place] 336-878-6098   °[facility] [address], [place], [place] 336-832-9800   °[facility] [address], [place], [place] ° 336-882-2125   °[facility] [address],  °[place], [place] 1-800-853-5163 or 336-641-4981   °Substance Abuse Resources °Organization         Address  Phone  Notes  °[facility]  336-882-2125   °[facility]  336-784-9470   °[facility]  336-285-9073   °[facility]  336-845-3988   °Residential & Outpatient Substance Abuse Program  1-800-659-3381   °Psychological Services °Organization         Address  Phone  Notes  °[facility]  336- 832-9600   °[facility]  336- 378-7881   °[facility] [address], [place] 1-800-853-5163 or 336-641-4981   ° °Mobile Crisis Teams °Organization         Address  Phone  Notes  °[facility], Mobile Crisis Care Unit  1-877-626-1772   °Assertive °[facility] ° [address]  [place], [place] 336-834-9664   °Sharon DeEsch [address] °[place] 336-554-5454   ° °Self-Help/Support Groups °Organization         Address  Phone             Notes  °[facility] [place] - variety of support groups  336- 373-1402 Call for more information  °Narcotics Anonymous (NA), [facility] [address], °[place]   2 meetings at this location  ° °  Residential Treatment Programs °Organization         Address  Phone  Notes  °[facility] [address],    °[place]  1-866-801-8205   °[facility] ° [address], [place], [place] 704-293-8524   °[facility] [address], [place] 336-845-3988 Admissions: 8am-3pm M-F  °[facility] [address]    °[place], [place] 336-841-1104   °[facility] [address], [place], [place] 336-379-7146   °[facility] [address]  °[place], [place] 336-285-9073   °[facility] - Intensive Outpatient [address], [place], [place] 336-852-3033   °[facility] [address]  °[place], [place] 1-877-615-2722 or 336-784-9470   °[facility] [address] [place], [place] 336-227-7417 Accepts Medicaid  °[facility] [address]  °[place] 1-800-659-3381 Substance Abuse/Addiction Treatment  ° °[place] Behavioral Health Resources °Organization         Address  Phone  Notes  °[facility]  (888) 581-9988   °Julie Brannon, PhD [address] [place], [place]   (336) 349-5553 or (336) 951-0000   ° Behavioral   [address] °[place], [place] (336) 349-4454   °[facility] [address], [place], [place] (336) 342-8316 Insurance/Medicaid/sponsorship through [facility]  °[facility] [address]                                    [place], [place] (336) 342-8316 Therapy/tele-psych/case    °[facility] [address]  ° [place], [place] (336) 349-2233    °Dr. Arfeen  (336) 349-4544   °[facility] [place]  [employer] [place] [facility] 1) [address], [place] °2) [address], [place] °3)  [address], [place] (336) 349-3220 °(336) 342-7768 ° °(336) 342-8140   °[place] [facility] (336) 342-1394 or (336) 342-3537 (After Hours)    ° ° °

## 2015-05-27 IMAGING — CR DG CHEST 2V
2 series · 2 of 2 positions shown · non-contrast
Comparison: None.

CLINICAL DATA: Fever.

EXAM:
CHEST  2 VIEW

[w chest pa]
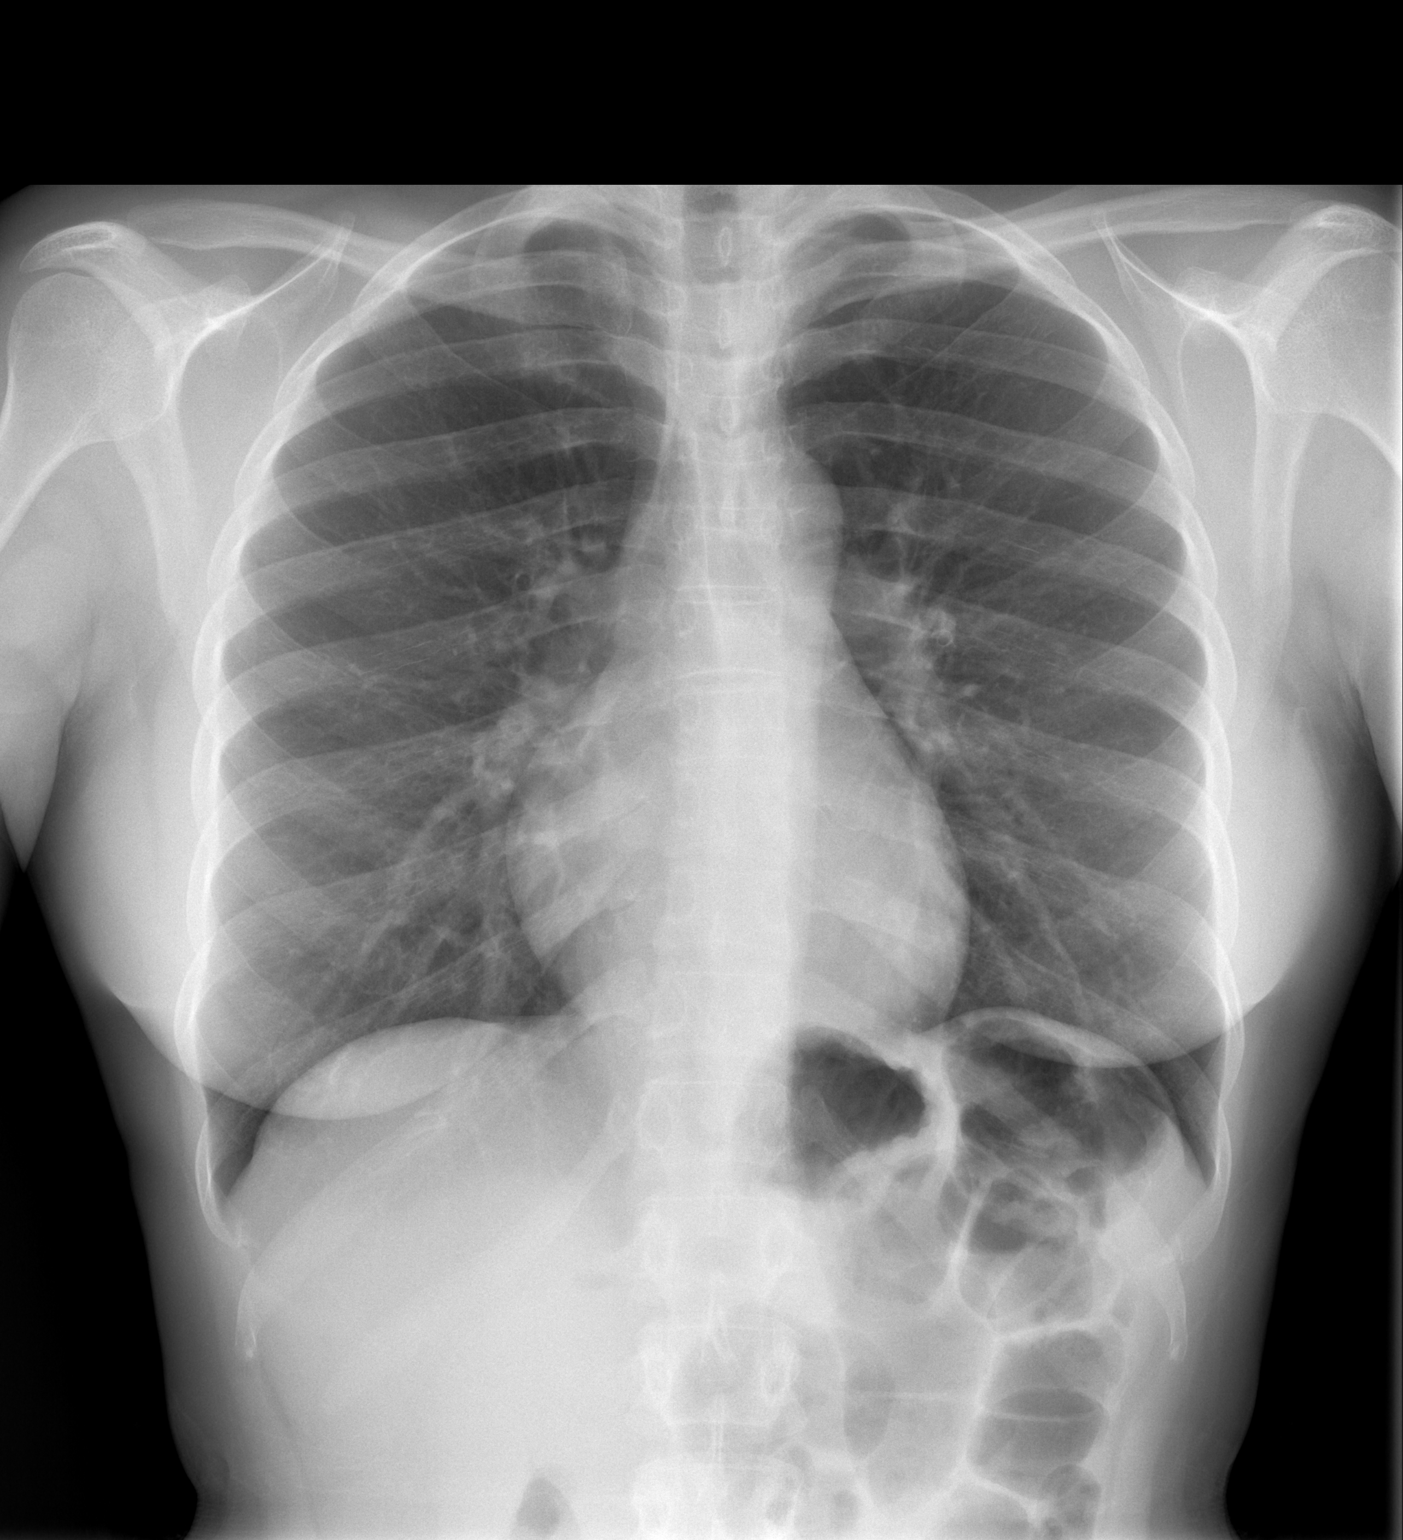

[w chest lat]
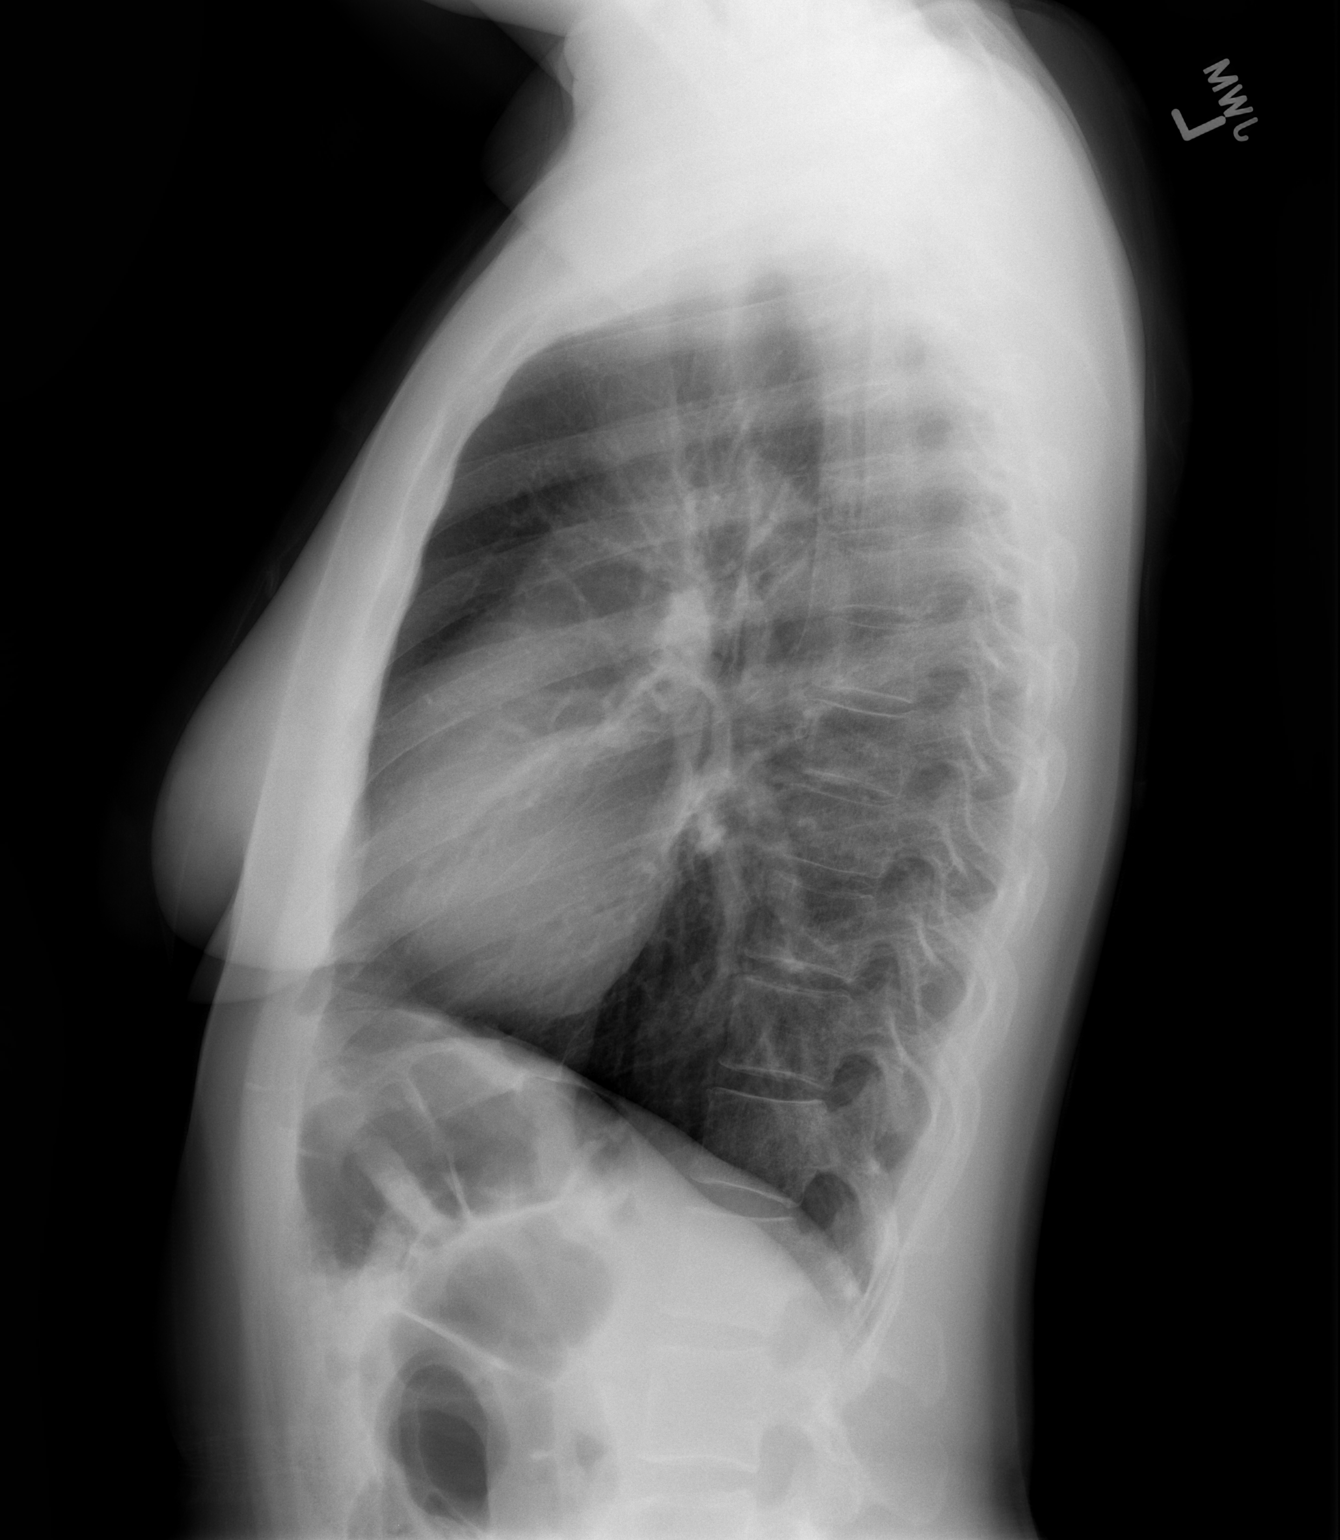

[2 of 2 positions shown; findings below may reference images not displayed]

FINDINGS: The heart size and mediastinal contours are within normal limits.
Both lungs are clear. No pneumothorax or pleural effusion is noted.
The visualized skeletal structures are unremarkable.
IMPRESSION: No acute cardiopulmonary abnormality seen.

## 2017-02-18 ENCOUNTER — Emergency Department (HOSPITAL_BASED_OUTPATIENT_CLINIC_OR_DEPARTMENT_OTHER): Payer: Self-pay

## 2017-02-18 ENCOUNTER — Emergency Department (HOSPITAL_BASED_OUTPATIENT_CLINIC_OR_DEPARTMENT_OTHER)
Admission: EM | Admit: 2017-02-18 | Discharge: 2017-02-18 | Disposition: A | Discharge status: Home / Self Care | Payer: Self-pay | Attending: Emergency Medicine | Admitting: Emergency Medicine

## 2017-02-18 ENCOUNTER — Encounter (HOSPITAL_BASED_OUTPATIENT_CLINIC_OR_DEPARTMENT_OTHER): Payer: Self-pay | Admitting: Emergency Medicine

## 2017-02-18 DIAGNOSIS — Z79899 Other long term (current) drug therapy: Secondary | ICD-10-CM | POA: Insufficient documentation

## 2017-02-18 DIAGNOSIS — J45909 Unspecified asthma, uncomplicated: Secondary | ICD-10-CM | POA: Insufficient documentation

## 2017-02-18 DIAGNOSIS — R079 Chest pain, unspecified: Secondary | ICD-10-CM | POA: Insufficient documentation

## 2017-02-18 DIAGNOSIS — D649 Anemia, unspecified: Secondary | ICD-10-CM | POA: Insufficient documentation

## 2017-02-18 LAB — CBC WITH DIFFERENTIAL/PLATELET
BASOS ABS: 0 10*3/uL (ref 0.0–0.1)
BASOS PCT: 0 %
Eosinophils Absolute: 0.1 10*3/uL (ref 0.0–0.7)
Eosinophils Relative: 1 %
HCT: 29.7 % — ABNORMAL LOW (ref 36.0–46.0)
Hemoglobin: 9.3 g/dL — ABNORMAL LOW (ref 12.0–15.0)
LYMPHS PCT: 29 %
Lymphs Abs: 2.1 10*3/uL (ref 0.7–4.0)
MCH: 24 pg — ABNORMAL LOW (ref 26.0–34.0)
MCHC: 31.3 g/dL (ref 30.0–36.0)
MCV: 76.5 fL — AB (ref 78.0–100.0)
Monocytes Absolute: 0.4 10*3/uL (ref 0.1–1.0)
Monocytes Relative: 6 %
NEUTROS ABS: 4.7 10*3/uL (ref 1.7–7.7)
Neutrophils Relative %: 64 %
PLATELETS: 358 10*3/uL (ref 150–400)
RBC: 3.88 MIL/uL (ref 3.87–5.11)
RDW: 18 % — AB (ref 11.5–15.5)
WBC: 7.3 10*3/uL (ref 4.0–10.5)

## 2017-02-18 LAB — BASIC METABOLIC PANEL
ANION GAP: 7 (ref 5–15)
BUN: 7 mg/dL (ref 6–20)
CALCIUM: 9.3 mg/dL (ref 8.9–10.3)
CO2: 21 mmol/L — AB (ref 22–32)
Chloride: 108 mmol/L (ref 101–111)
Creatinine, Ser: 0.79 mg/dL (ref 0.44–1.00)
GLUCOSE: 96 mg/dL (ref 65–99)
Potassium: 3.7 mmol/L (ref 3.5–5.1)
Sodium: 136 mmol/L (ref 135–145)

## 2017-02-18 LAB — OCCULT BLOOD X 1 CARD TO LAB, STOOL: FECAL OCCULT BLD: NEGATIVE

## 2017-02-18 LAB — TROPONIN I: Troponin I: <0.03 ng/mL (ref <0.03)

## 2017-02-18 MED ORDER — FAMOTIDINE 20 MG PO TABS: 20.0000 mg | ORAL_TABLET | Freq: Two times a day (BID) | ORAL | 0 refills | Status: AC

## 2017-02-18 MED ORDER — FAMOTIDINE 20 MG PO TABS
20.0000 mg | ORAL_TABLET | Freq: Once | ORAL | Status: AC
  Administered 2017-02-18: 20 mg via ORAL
  Filled 2017-02-18: qty 1

## 2017-02-18 MED ORDER — FERROUS SULFATE 325 (65 FE) MG PO TABS: 325.0000 mg | ORAL_TABLET | Freq: Every day | ORAL | 0 refills | Status: AC

## 2017-02-18 MED ORDER — GI COCKTAIL ~~LOC~~
30.0000 mL | Freq: Once | ORAL | Status: DC
  Filled 2017-02-18: qty 30

## 2017-02-18 NOTE — ED Provider Notes (Signed)
[facility] DEPT [facility] Provider Note   CSN: [id number] Arrival date & time: 02/18/17  0732     History   Chief Complaint Chief Complaint  Patient presents with  . Chest Pain    HPI Shelly Webb is a 41 y.o. female.  HPI Patient reports she got to the bathroom last night and noticed she had some tightness on the right side of her chest. She reports earlier she also noticed that her whole back just seemed a little sore. She and her husband had been lying on the couch watching television in both their backs kind of hurts so she thought was due to positioning. She went back to sleep and then later noted that she had some tightness seemed to move over to the left side of the chest. It just felt kind of like strained muscles although she reports she never has strained muscles. She reports this morning she started belching and passing a lot of gas. There was no associated shortness of breath, nausea, vomiting, lightheadedness or sweating. No radiation into her arms or her neck. Patient reports that she was reading on the Internet about possible diagnoses and got very anxious. She determined the best thing was to come and get checked and just make sure it wasn't serious. Patient's greatest level of physical exertion is going up and down a flight of steps carrying a laundry basket. She reports that she has not had any problems with dyspnea or exertional chest pain doing this activity. Past Medical History:  Diagnosis Date  . Asthma     There are no active problems to display for this patient.   Past Surgical History:  Procedure Laterality Date  . CESAREAN SECTION      OB History    No data available       Home Medications    Prior to Admission medications   Medication Sig Start Date End Date Taking? Authorizing Provider  calcium carbonate (TUMS - DOSED IN MG ELEMENTAL CALCIUM) 500 MG chewable tablet Chew 1 tablet by mouth daily.    [provider]  famotidine  (PEPCID) 20 MG tablet Take 1 tablet (20 mg total) by mouth 2 (two) times daily. 02/18/17   Charlesetta Shanks, MD  ferrous sulfate 325 (65 FE) MG tablet Take 1 tablet (325 mg total) by mouth daily. 02/18/17   Charlesetta Shanks, MD  pantoprazole (PROTONIX) 20 MG tablet Take 1 tablet (20 mg total) by mouth daily. 02/04/15   Mesner, Corene Cornea, MD  ranitidine (ZANTAC) 150 MG tablet Take 150 mg by mouth 2 (two) times daily.    [provider]    Family History No family history on file.  Social History Social History  Substance Use Topics  . Smoking status: Never Smoker  . Smokeless tobacco: Never Used  . Alcohol use Yes     Comment: social      Allergies   Penicillins   Review of Systems Review of Systems 10 Systems reviewed and are negative for acute change except as noted in the HPI.   Physical Exam Updated Vital Signs BP (!) 119/91 (BP Location: Right Arm)   Pulse 70   Temp 98.1 F (36.7 C) (Oral)   Resp 15   Ht 5\' 3"  (1.6 m)   Wt 63.5 kg (140 lb)   LMP 02/06/2017   SpO2 99%   BMI 24.80 kg/m   Physical Exam  Constitutional: She is oriented to person, place, and time. She appears well-developed and well-nourished. No distress.  HENT:  Head: Normocephalic and atraumatic.  Eyes: Conjunctivae and EOM are normal.  Neck: Neck supple.  Cardiovascular: Normal rate, regular rhythm, normal heart sounds and intact distal pulses.   No murmur heard. Pulmonary/Chest: Effort normal and breath sounds normal. No respiratory distress. She exhibits no tenderness.  Abdominal: Soft. She exhibits no distension. There is no tenderness. There is no guarding.  Genitourinary:  Genitourinary Comments: Rectal: No stool in the vault. Nontender. No melena or red  blood.   Musculoskeletal: She exhibits no edema or tenderness.  Normal lower extremities with no calf tenderness or peripheral edema.  Neurological: She is alert and oriented to person, place, and time. No cranial nerve deficit. She  exhibits normal muscle tone. Coordination normal.  Skin: Skin is warm and dry.  Psychiatric: She has a normal mood and affect.  Nursing note and vitals reviewed.    ED Treatments / Results  Labs (all labs ordered are listed, but only abnormal results are displayed) Labs Reviewed  CBC WITH DIFFERENTIAL/PLATELET - Abnormal; Notable for the following:       Result Value   Hemoglobin 9.3 (*)    HCT 29.7 (*)    MCV 76.5 (*)    MCH 24.0 (*)    RDW 18.0 (*)    All other components within normal limits  BASIC METABOLIC PANEL - Abnormal; Notable for the following:    CO2 21 (*)    All other components within normal limits  TROPONIN I  OCCULT BLOOD X 1 CARD TO LAB, STOOL    EKG  EKG Interpretation  Date/Time:  Sunday February 18 2017 07:39:52 EDT Ventricular Rate:  89 PR Interval:    QRS Duration: 86 QT Interval:  365 QTC Calculation: 445 R Axis:   76 Text Interpretation:  Sinus rhythm Biatrial enlargement Repol abnrm suggests ischemia, inferior leads Baseline wander in lead(s) V3 V4 no change from previous Confirmed by Charlesetta Shanks [phone]) on 02/18/2017 8:16:41 AM       Radiology Dg Chest 2 View  Result Date: 02/18/2017 CLINICAL DATA:  Right-sided chest pain/ tightness. EXAM: CHEST  2 VIEW COMPARISON:  09/29/2013 FINDINGS: The cardiomediastinal silhouette is within normal limits. The lungs are well inflated and clear. There is no evidence of pleural effusion or pneumothorax. No acute osseous abnormality is identified. IMPRESSION: No active cardiopulmonary disease. Electronically Signed   By: Logan Bores M.D.   On: 02/18/2017 08:30    Procedures Procedures (including critical care time)  Medications Ordered in ED Medications  gi cocktail (Maalox,Lidocaine,Donnatal) (30 mLs Oral Not Given 02/18/17 0843)  famotidine (PEPCID) tablet 20 mg (20 mg Oral Given 02/18/17 0843)     Initial Impression / Assessment and Plan / ED Course  I have reviewed the triage vital signs and  the nursing notes.  Pertinent labs & imaging results that were available during my care of the patient were reviewed by me and considered in my medical decision making (see chart for details).      Final Clinical Impressions(s) / ED Diagnoses   Final diagnoses:  Chest pain, unspecified type  Anemia, unspecified type  Patient is a chest pain which sounds consistent with reflux type symptoms. The improves this morning after passage of gas and belching. Patient appears low risk for cardiac ischemic type of chest pain. Patient does have anemia. Review of EMR indicates this is been chronic but slightly more than previous. Patient does identify monthly, regular menses that at times are heavy. She does identify frequently wanting to  eat ice. Patient otherwise is not symptomatic. Vital signs are stable. He does not experience lightheadedness or dyspnea. This is most likely iron deficiency anemia from menstrual blood loss. In his however consult on the importance of follow-up with PCP for continued evaluation. She reports understanding and plan to follow-up with the same PCP her mother sees. Will start the patient empirically on iron and Pepcid.  New Prescriptions New Prescriptions   FAMOTIDINE (PEPCID) 20 MG TABLET    Take 1 tablet (20 mg total) by mouth 2 (two) times daily.   FERROUS SULFATE 325 (65 FE) MG TABLET    Take 1 tablet (325 mg total) by mouth daily.     Charlesetta Shanks, MD 02/18/17 1030

## 2017-02-18 NOTE — ED Triage Notes (Signed)
Chest pain to right and left chest intermittently x 2 days . Denies pain at present or SOB or n/v.

## 2017-02-18 NOTE — ED Notes (Signed)
Patient denies any chest pain at this time. Patient to x-ray

## 2018-05-07 ENCOUNTER — Emergency Department (HOSPITAL_BASED_OUTPATIENT_CLINIC_OR_DEPARTMENT_OTHER): Payer: No Typology Code available for payment source

## 2018-05-07 ENCOUNTER — Other Ambulatory Visit: Payer: Self-pay

## 2018-05-07 ENCOUNTER — Encounter (HOSPITAL_BASED_OUTPATIENT_CLINIC_OR_DEPARTMENT_OTHER): Payer: Self-pay | Admitting: Emergency Medicine

## 2018-05-07 ENCOUNTER — Emergency Department (HOSPITAL_BASED_OUTPATIENT_CLINIC_OR_DEPARTMENT_OTHER)
Admission: EM | Admit: 2018-05-07 | Discharge: 2018-05-07 | Disposition: A | Discharge status: Home / Self Care | Payer: No Typology Code available for payment source | Attending: Emergency Medicine | Admitting: Emergency Medicine

## 2018-05-07 DIAGNOSIS — Z79899 Other long term (current) drug therapy: Secondary | ICD-10-CM | POA: Insufficient documentation

## 2018-05-07 DIAGNOSIS — J45909 Unspecified asthma, uncomplicated: Secondary | ICD-10-CM | POA: Insufficient documentation

## 2018-05-07 DIAGNOSIS — M79604 Pain in right leg: Secondary | ICD-10-CM | POA: Insufficient documentation

## 2018-05-07 NOTE — Discharge Instructions (Addendum)
Ibuprofen 600 mg every 6 hours as needed for pain.  Rest.  Apply heating pad as needed.  Follow-up with primary doctor if symptoms or not improving in the next week.

## 2018-05-07 NOTE — ED Notes (Signed)
Patient transported to Ultrasound 
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
## 2019-02-09 ENCOUNTER — Emergency Department (HOSPITAL_BASED_OUTPATIENT_CLINIC_OR_DEPARTMENT_OTHER)
Admission: EM | Admit: 2019-02-09 | Discharge: 2019-02-09 | Disposition: A | Discharge status: Home / Self Care | Payer: Self-pay | Attending: Emergency Medicine | Admitting: Emergency Medicine

## 2019-02-09 ENCOUNTER — Emergency Department (HOSPITAL_BASED_OUTPATIENT_CLINIC_OR_DEPARTMENT_OTHER): Payer: Self-pay

## 2019-02-09 ENCOUNTER — Encounter (HOSPITAL_BASED_OUTPATIENT_CLINIC_OR_DEPARTMENT_OTHER): Payer: Self-pay | Admitting: Emergency Medicine

## 2019-02-09 ENCOUNTER — Other Ambulatory Visit: Payer: Self-pay

## 2019-02-09 DIAGNOSIS — M25562 Pain in left knee: Secondary | ICD-10-CM | POA: Insufficient documentation

## 2019-02-09 NOTE — ED Triage Notes (Signed)
Pt here after falling over small table and hitting both knees. Left knee is very painful, right knee getting better.

## 2019-02-09 NOTE — ED Provider Notes (Signed)
[facility] EMERGENCY DEPARTMENT Provider Note   CSN: [id number] Arrival date & time: 02/09/19  [id number]     History   Chief Complaint Chief Complaint  Patient presents with  . Knee Pain    HPI Shelly Webb is a 43 y.o. female.     The history is provided by the patient.  Knee Pain Location:  Knee Time since incident:  3 days Injury: yes   Knee location:  L knee Pain details:    Quality:  Dull   Radiates to:  Does not radiate   Severity:  Mild   Onset quality:  Gradual   Duration:  3 days   Timing:  Intermittent   Progression:  Waxing and waning Chronicity:  New Prior injury to area:  No Relieved by:  Acetaminophen and NSAIDs Worsened by:  Activity Associated symptoms: no back pain, no decreased ROM, no fatigue, no fever, no itching, no muscle weakness, no neck pain, no numbness, no stiffness, no swelling and no tingling     Past Medical History:  Diagnosis Date  . Asthma     There are no active problems to display for this patient.   Past Surgical History:  Procedure Laterality Date  . CESAREAN SECTION       OB History   No obstetric history on file.      Home Medications    Prior to Admission medications   Medication Sig Start Date End Date Taking? Authorizing Provider  calcium carbonate (TUMS - DOSED IN MG ELEMENTAL CALCIUM) 500 MG chewable tablet Chew 1 tablet by mouth daily.    [provider]  famotidine (PEPCID) 20 MG tablet Take 1 tablet (20 mg total) by mouth 2 (two) times daily. 02/18/17   Charlesetta Shanks, MD  ferrous sulfate 325 (65 FE) MG tablet Take 1 tablet (325 mg total) by mouth daily. 02/18/17   Charlesetta Shanks, MD  pantoprazole (PROTONIX) 20 MG tablet Take 1 tablet (20 mg total) by mouth daily. 02/04/15   Mesner, Corene Cornea, MD  ranitidine (ZANTAC) 150 MG tablet Take 150 mg by mouth 2 (two) times daily.    [provider]    Family History History reviewed. No pertinent family history.  Social History  Social History   Tobacco Use  . Smoking status: Never Smoker  . Smokeless tobacco: Never Used  Substance Use Topics  . Alcohol use: Yes    Comment: social   . Drug use: No     Allergies   Penicillins   Review of Systems Review of Systems  Constitutional: Negative for chills, fatigue and fever.  HENT: Negative for ear pain and sore throat.   Eyes: Negative for pain and visual disturbance.  Respiratory: Negative for cough and shortness of breath.   Cardiovascular: Negative for chest pain and palpitations.  Gastrointestinal: Negative for abdominal pain and vomiting.  Genitourinary: Negative for dysuria and hematuria.  Musculoskeletal: Positive for arthralgias. Negative for back pain, joint swelling, neck pain and stiffness.  Skin: Negative for color change, itching and rash.  Neurological: Negative for seizures and syncope.  All other systems reviewed and are negative.    Physical Exam Updated Vital Signs  ED Triage Vitals  Enc Vitals Group     BP 02/09/19 0836 130/78     Pulse Rate 02/09/19 0836 79     Resp 02/09/19 0836 18     Temp 02/09/19 0836 98.2 F (36.8 C)     Temp Source 02/09/19 0836 Oral  SpO2 02/09/19 0836 100 %     Weight --      Height --      Head Circumference --      Peak Flow --      Pain Score 02/09/19 0839 8     Pain Loc --      Pain Edu? --      Excl. in [place]? --     Physical Exam Vitals signs and nursing note reviewed.  Constitutional:      General: She is not in acute distress.    Appearance: She is well-developed.  HENT:     Head: Normocephalic and atraumatic.  Abdominal:     Tenderness: There is no abdominal tenderness.  Musculoskeletal: Normal range of motion.        General: Tenderness present. No swelling.     Comments: Tenderness around the left knee with no major swelling  Skin:    General: Skin is warm and dry.  Neurological:     General: No focal deficit present.     Mental Status: She is alert.     Sensory: No  sensory deficit.     Motor: No weakness.     Comments: 5+ out of 5 strength throughout left lower extremity, normal sensation      ED Treatments / Results  Labs (all labs ordered are listed, but only abnormal results are displayed) Labs Reviewed - No data to display  EKG None  Radiology Dg Knee 2 Views Left  Result Date: 02/09/2019 CLINICAL DATA:  LEFT knee pain after fall going up stairs. EXAM: LEFT KNEE - 1-2 VIEW COMPARISON:  None. FINDINGS: No evidence of fracture, dislocation, or joint effusion. No evidence of arthropathy or other focal bone abnormality. Soft tissues are unremarkable. IMPRESSION: Negative. Electronically Signed   By: Franki Cabot M.D.   On: 02/09/2019 09:06    Procedures Procedures (including critical care time)  Medications Ordered in ED Medications - No data to display   Initial Impression / Assessment and Plan / ED Course  I have reviewed the triage vital signs and the nursing notes.  Pertinent labs & imaging results that were available during my care of the patient were reviewed by me and considered in my medical decision making (see chart for details).     Shelly Webb is a 43 year old female with no significant medical history presents to the ED with pain to the left knee.  Patient with injury 3 days ago in which she hit both of her knees at a small table.  There was no twisting motion.  She has been able to ambulate for the last several days without much pain.  She does not have much pain in the right knee anymore.  Pain is mostly in the left knee mostly when going up and down stairs.  Overall no obvious ligamentous injury.  No major swelling.  X-ray of the left knee was overall unremarkable.  Possibly contusion versus small meniscal issue.  Normal strength and sensation in the lower extremities.  We will give her crutches.  Recommend Tylenol, Motrin, ice.  Written for light duty for work.  Recommend follow-up with primary care doctor if pain is  persistent.  Given information for sports medicine as well if needed.  Discharged in ED in good condition.  Given return precautions.  This chart was dictated using voice recognition software.  Despite best efforts to proofread,  errors can occur which can change the documentation meaning.    Final  Clinical Impressions(s) / ED Diagnoses   Final diagnoses:  Acute pain of left knee    ED Discharge Orders    None       Lennice Sites, DO 02/09/19 [id number]

## 2019-02-09 NOTE — Discharge Instructions (Signed)
X-ray showed no fracture.  Continue 800 mg of Motrin every 8 hours for the next 5 days.  Continue Tylenol 1000 mg every 6 hours for the next 5 days as well.  Follow-up with your primary care doctor or sports medicine if pain continues over the next several weeks.  Use crutches as needed.

## 2020-02-28 ENCOUNTER — Other Ambulatory Visit: Payer: Self-pay

## 2020-02-28 ENCOUNTER — Emergency Department (HOSPITAL_BASED_OUTPATIENT_CLINIC_OR_DEPARTMENT_OTHER): Payer: BLUE CROSS/BLUE SHIELD

## 2020-02-28 ENCOUNTER — Encounter (HOSPITAL_BASED_OUTPATIENT_CLINIC_OR_DEPARTMENT_OTHER): Payer: Self-pay | Admitting: *Deleted

## 2020-02-28 ENCOUNTER — Emergency Department (HOSPITAL_BASED_OUTPATIENT_CLINIC_OR_DEPARTMENT_OTHER)
Admission: EM | Admit: 2020-02-28 | Discharge: 2020-02-28 | Disposition: A | Discharge status: Home / Self Care | Payer: BLUE CROSS/BLUE SHIELD | Attending: Emergency Medicine | Admitting: Emergency Medicine

## 2020-02-28 DIAGNOSIS — R2241 Localized swelling, mass and lump, right lower limb: Secondary | ICD-10-CM | POA: Diagnosis not present

## 2020-02-28 DIAGNOSIS — J45909 Unspecified asthma, uncomplicated: Secondary | ICD-10-CM | POA: Insufficient documentation

## 2020-02-28 DIAGNOSIS — M7989 Other specified soft tissue disorders: Secondary | ICD-10-CM

## 2020-02-28 DIAGNOSIS — R202 Paresthesia of skin: Secondary | ICD-10-CM | POA: Diagnosis not present

## 2020-02-28 DIAGNOSIS — Z510 Encounter for antineoplastic radiation therapy: Secondary | ICD-10-CM | POA: Insufficient documentation

## 2020-02-28 DIAGNOSIS — C50919 Malignant neoplasm of unspecified site of unspecified female breast: Secondary | ICD-10-CM | POA: Diagnosis not present

## 2020-02-28 HISTORY — DX: Malignant (primary) neoplasm, unspecified: C80.1

## 2020-02-28 NOTE — ED Notes (Signed)
Taken to US at this time. 

## 2020-02-28 NOTE — ED Triage Notes (Signed)
Pt noticed swelling in her right leg today. She went to urgent care and her d-dimer was elevated so they sent her here to rule out a blood clot

## 2020-02-28 NOTE — ED Provider Notes (Signed)
[facility] Provider Note   CSN: [id number] Arrival date & time: 02/28/20  1624     History Chief Complaint  Patient presents with  . Leg Swelling    Shelly Webb is a 44 y.o. female.  HPI  Patient is a 44 year old female with a history of asthma as well as breast cancer who presents to the emergency department for DVT rule out.  Patient was seen at an urgent care earlier today after noticing some mild swelling in the right calf.  She had a mildly elevated quantitative D-dimer at 540.  She was then sent to the emergency department for an ultrasound.  Patient confirms her leg swelling but otherwise has no acute complaints.  She reports some intermittent chronic paresthesias in the toes of the bilateral feet which she does not feel have recently worsened.  She is currently being treated for breast cancer.  Her last round of chemotherapy was in July.  She is currently undergoing radiation therapy.  No fevers, chills, chest pain, shortness of breath, nausea, vomiting.     Past Medical History:  Diagnosis Date  . Asthma   . Cancer ([place])     There are no problems to display for this patient.   Past Surgical History:  Procedure Laterality Date  . CESAREAN SECTION       OB History   No obstetric history on file.     No family history on file.  Social History   Tobacco Use  . Smoking status: Never Smoker  . Smokeless tobacco: Never Used  Vaping Use  . Vaping Use: Never used  Substance Use Topics  . Alcohol use: Yes    Comment: social   . Drug use: No    Home Medications Prior to Admission medications   Medication Sig Start Date End Date Taking? Authorizing Provider  calcium carbonate (TUMS - DOSED IN MG ELEMENTAL CALCIUM) 500 MG chewable tablet Chew 1 tablet by mouth daily.    [provider]  famotidine (PEPCID) 20 MG tablet Take 1 tablet (20 mg total) by mouth 2 (two) times daily. 02/18/17   Charlesetta Shanks, MD  ferrous  sulfate 325 (65 FE) MG tablet Take 1 tablet (325 mg total) by mouth daily. 02/18/17   Charlesetta Shanks, MD  pantoprazole (PROTONIX) 20 MG tablet Take 1 tablet (20 mg total) by mouth daily. 02/04/15   Mesner, Corene Cornea, MD  ranitidine (ZANTAC) 150 MG tablet Take 150 mg by mouth 2 (two) times daily.    [provider]    Allergies    Penicillins  Review of Systems   Review of Systems  Constitutional: Negative for chills and fever.  Respiratory: Negative for shortness of breath.   Cardiovascular: Positive for leg swelling. Negative for chest pain.  Gastrointestinal: Negative for nausea and vomiting.    Physical Exam Updated Vital Signs BP (!) 121/95 (BP Location: Left Arm)   Pulse 94   Temp 98.9 F (37.2 C) (Oral)   Resp 16   Ht 5\' 3"  (1.6 m)   Wt 57.2 kg   SpO2 100%   BMI 22.32 kg/m   Physical Exam Vitals and nursing note reviewed.  Constitutional:      General: She is not in acute distress.    Appearance: Normal appearance. She is not ill-appearing, toxic-appearing or diaphoretic.  HENT:     Head: Normocephalic and atraumatic.     Right Ear: External ear normal.     Left Ear: External ear normal.  Nose: Nose normal.     Mouth/Throat:     Mouth: Mucous membranes are moist.     Pharynx: Oropharynx is clear. No oropharyngeal exudate or posterior oropharyngeal erythema.  Eyes:     Extraocular Movements: Extraocular movements intact.  Cardiovascular:     Rate and Rhythm: Normal rate and regular rhythm.     Pulses: Normal pulses.     Heart sounds: Normal heart sounds. No murmur heard.  No friction rub. No gallop.      Comments: Regular rate and rhythm without any murmurs, rubs, gallops. Pulmonary:     Effort: Pulmonary effort is normal. No respiratory distress.     Breath sounds: Normal breath sounds. No stridor. No wheezing, rhonchi or rales.  Abdominal:     General: Abdomen is flat.     Tenderness: There is no abdominal tenderness.  Musculoskeletal:         General: Normal range of motion.     Cervical back: Normal range of motion and neck supple. No tenderness.     Right lower leg: Edema present.     Left lower leg: No edema.     Comments: Slight edema noted in the right lower extremity when compared to left.  No calf tenderness.  No palpable cords.  Please see images below.  Palpable pedal pulses.  Patient is moving toes of the feet without difficulty.  Distal sensation intact.  Skin:    General: Skin is warm and dry.  Neurological:     General: No focal deficit present.     Mental Status: She is alert and oriented to person, place, and time.  Psychiatric:        Mood and Affect: Mood normal.        Behavior: Behavior normal.      ED Results / Procedures / Treatments   Labs (all labs ordered are listed, but only abnormal results are displayed) Labs Reviewed - No data to display  EKG None  Radiology US Venous Img Lower Unilateral Right  Result Date: 02/28/2020 CLINICAL DATA:  Pain EXAM: RIGHT LOWER EXTREMITY VENOUS DOPPLER ULTRASOUND TECHNIQUE: Gray-scale sonography with compression, as well as color and duplex ultrasound, were performed to evaluate the deep venous system(s) from the level of the common femoral vein through the popliteal and proximal calf veins. COMPARISON:  May 07, 2018 FINDINGS: VENOUS Normal compressibility of the common femoral, superficial femoral, and popliteal veins, as well as the visualized calf veins. Visualized portions of profunda femoral vein and great saphenous vein unremarkable. No filling defects to suggest DVT on grayscale or color Doppler imaging. Doppler waveforms show normal direction of venous flow, normal respiratory plasticity and response to augmentation. Limited views of the contralateral common femoral vein are unremarkable. OTHER None. Limitations: none IMPRESSION: No evidence of DVT. Electronically Signed   By: Valentino Saxon MD   On: 02/28/2020 17:51   Procedures Procedures    Medications Ordered in ED Medications - No data to display  ED Course  I have reviewed the triage vital signs and the nursing notes.  Pertinent labs & imaging results that were available during my care of the patient were reviewed by me and considered in my medical decision making (see chart for details).    MDM Rules/Calculators/A&P                          Patient is a 44 year old female who presents to the emergency department today due to swelling  in the right calf.  Physical exam significant for some very trace edema noted in the right lower extremity.  Please see images above.  No calf pain or palpable cords.  No chest pain or shortness of breath.  Patient is afebrile, not tachycardic, not hypoxic.  Patient was initially seen in urgent care and was found to have a elevated D-dimer and was then sent to the emergency department for DVT rule out.  I obtained an ultrasound of the right lower extremity which was negative for DVT.  This was discussed with the patient.  She was extremely relieved.  She is having no pain in the leg.  Will discharge at this time.  She is planning on following up with her oncologist next week if she finds that her symptoms have persisted.  Recommended compression of the leg and elevation for edema.  Her questions were answered and she was amicable at the time of discharge.  Her vital signs are stable.  Note: Portions of this report may have been transcribed using voice recognition software. Every effort was made to ensure accuracy; however, inadvertent computerized transcription errors may be present.   Final Clinical Impression(s) / ED Diagnoses Final diagnoses:  Leg swelling   Rx / DC Orders ED Discharge Orders    None       Rayna Sexton, PA-C 02/28/20 1805    Lucrezia Starch, MD 03/01/20 1235

## 2020-02-28 NOTE — Discharge Instructions (Signed)
Please return to the ER with any new or worsening symptoms.  Please follow-up with your oncologist next week if your symptoms persist.  It was a pleasure to meet you.

## 2020-10-06 IMAGING — DX DG KNEE 1-2V*L*
2 series · 2 of 2 positions shown · non-contrast
Comparison: None.

CLINICAL DATA: LEFT knee pain after fall going up stairs.

EXAM:
LEFT KNEE - 1-2 VIEW

[knee ap]
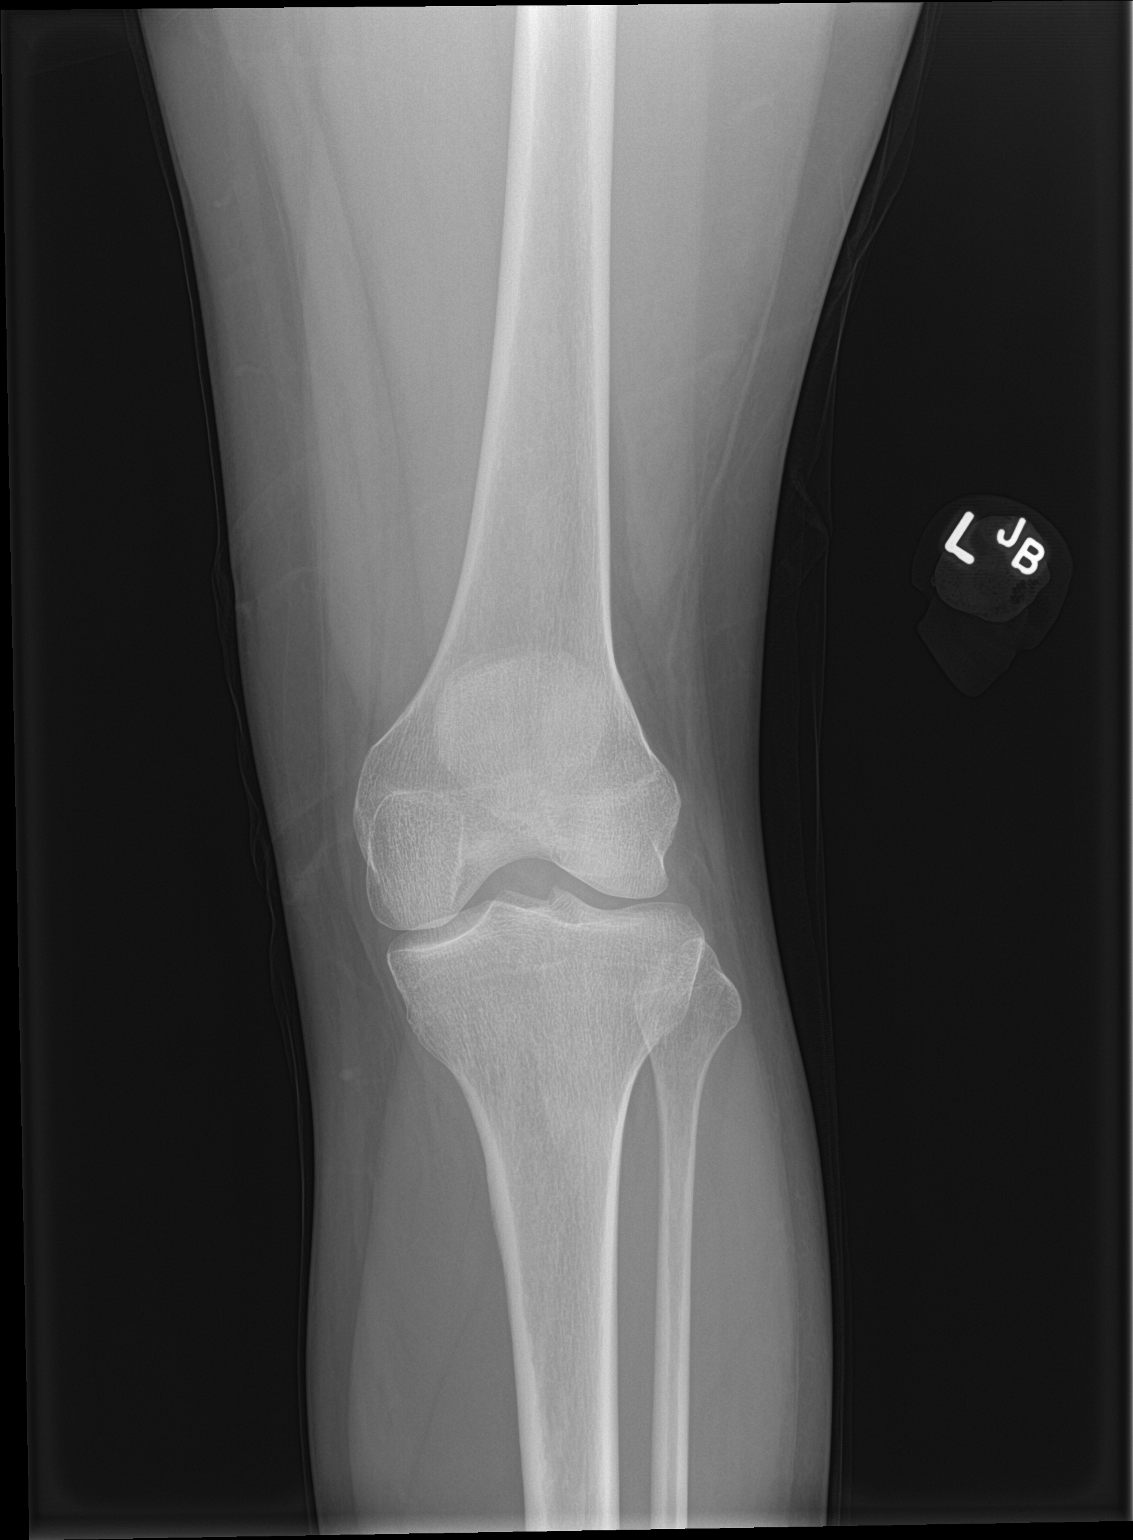

[knee lat]
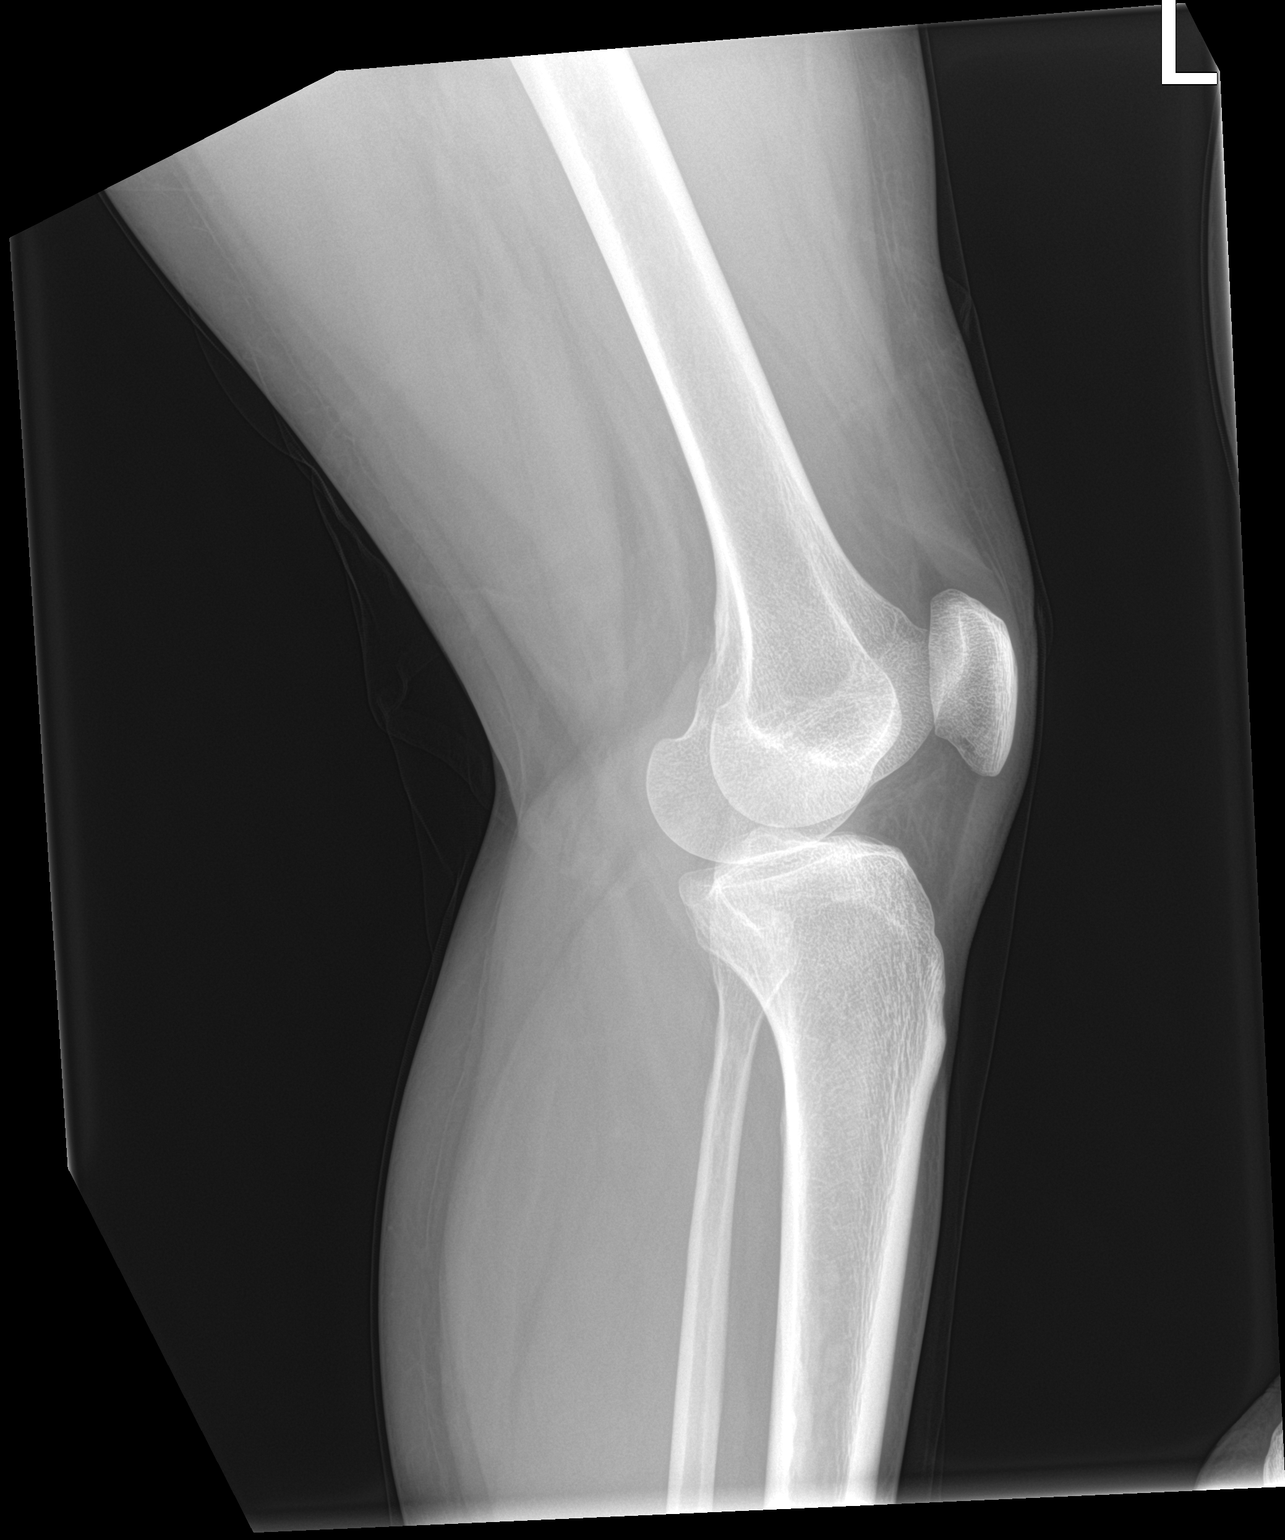

[2 of 2 positions shown; findings below may reference images not displayed]

FINDINGS: No evidence of fracture, dislocation, or joint effusion. No evidence
of arthropathy or other focal bone abnormality. Soft tissues are
unremarkable.
IMPRESSION: Negative.

## 2021-10-25 IMAGING — US US EXTREM LOW VENOUS*R*
1 series · 14 of 24 positions shown · non-contrast
Comparison: May 07, 2018

CLINICAL DATA: Pain

EXAM:
RIGHT LOWER EXTREMITY VENOUS DOPPLER ULTRASOUND
TECHNIQUE: Gray-scale sonography with compression, as well as color and duplex
ultrasound, were performed to evaluate the deep venous system(s)
from the level of the common femoral vein through the popliteal and
proximal calf veins.

[Series 1: us extrem low venous*right* · 14 of 31 slices shown]
[im 1/31]
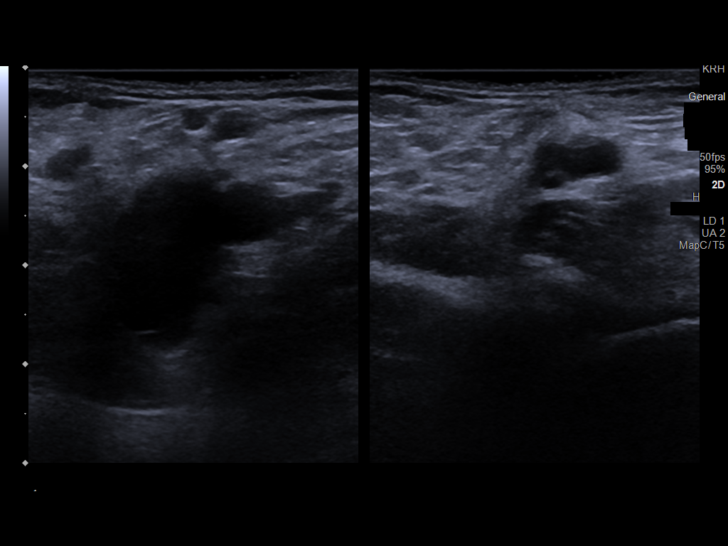
[im 3/31]
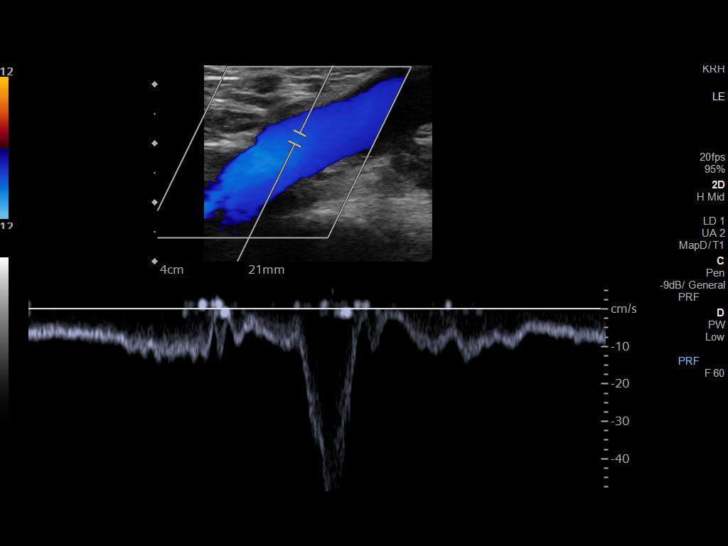
[im 6/31]
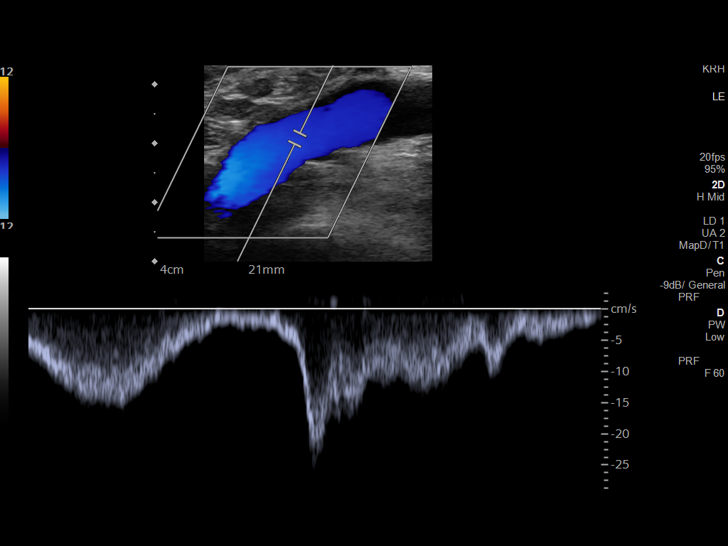
[im 8/31]
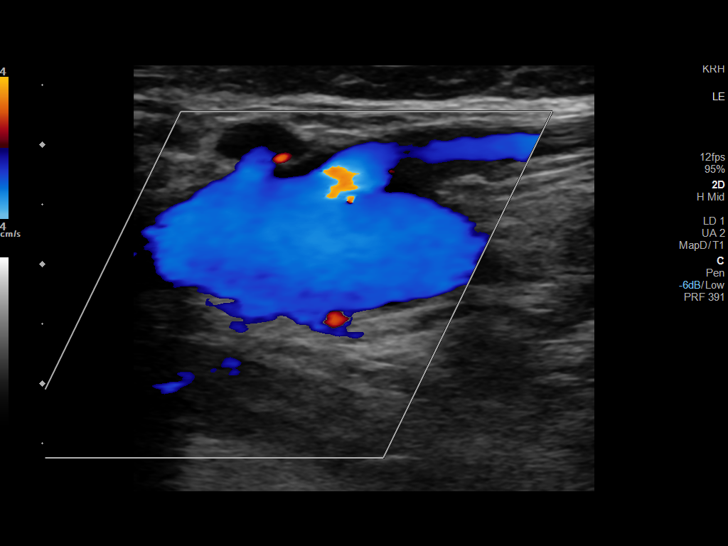
[im 10/31]
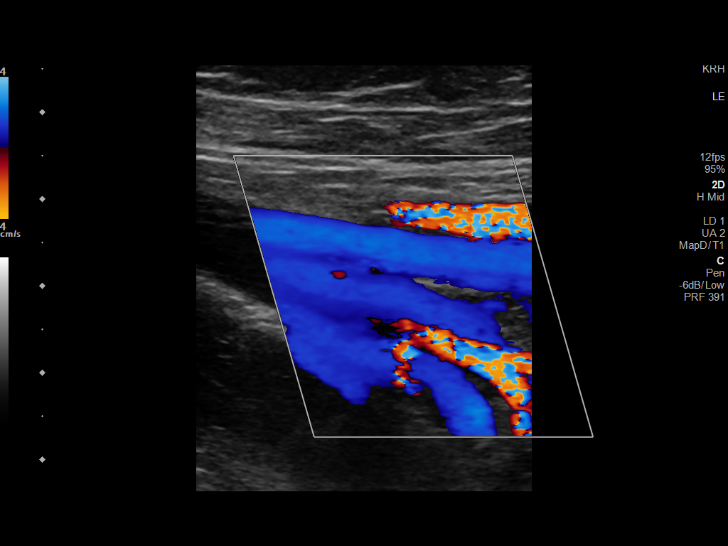
[im 12/31]
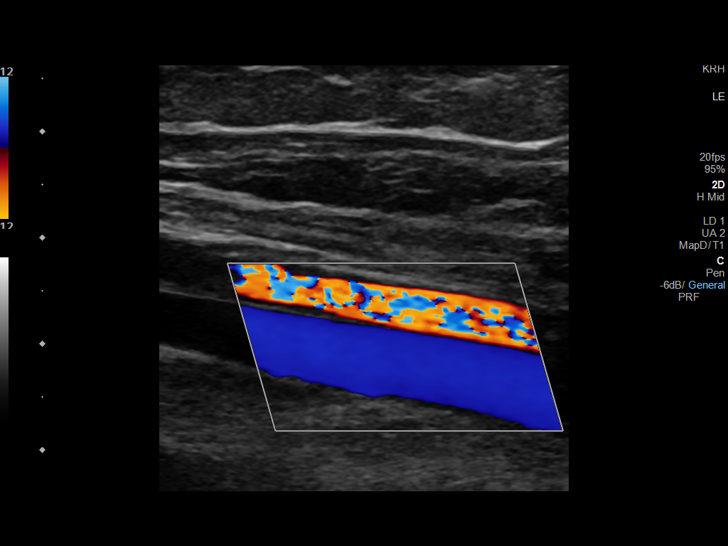
[im 15/31]
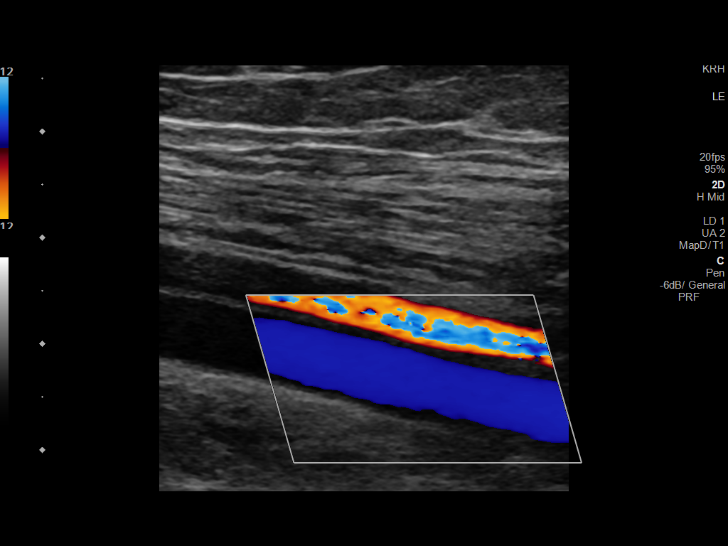
[im 16/31]
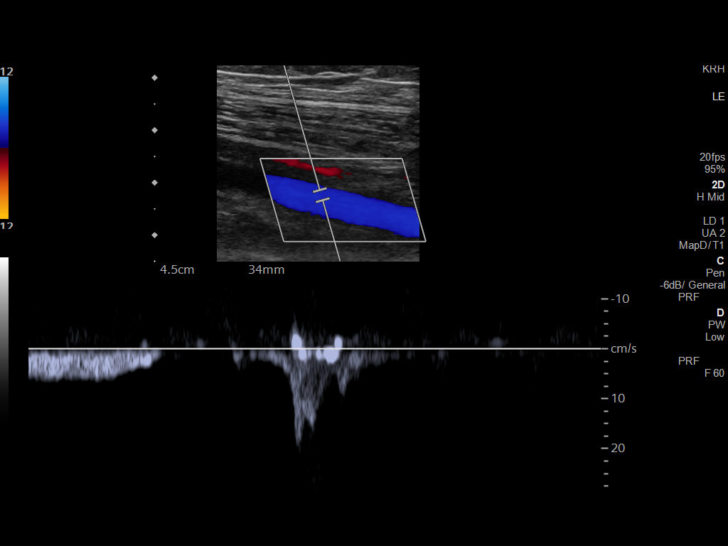
[im 19/31]
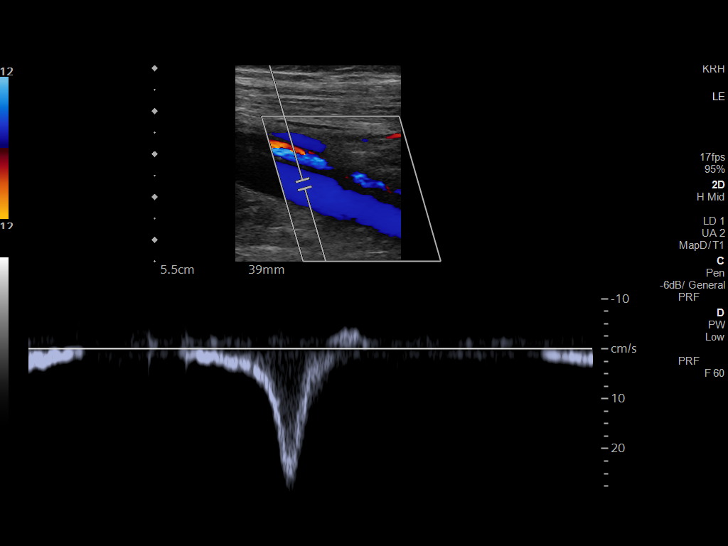
[im 21/31]
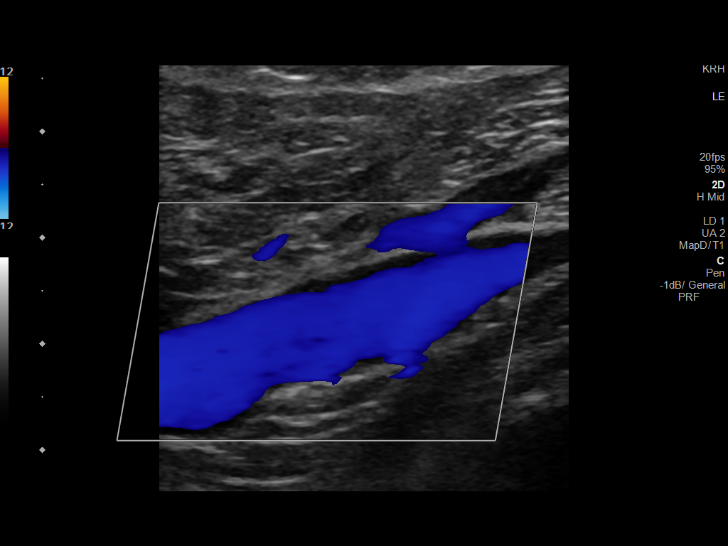
[im 24/31]
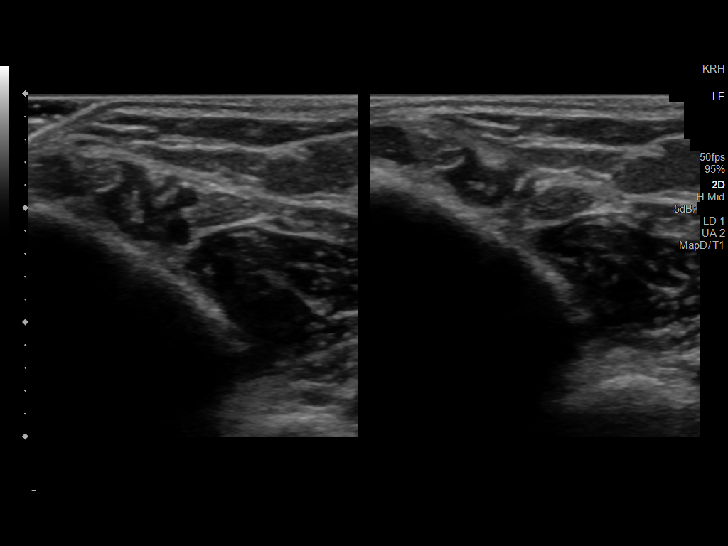
[im 25/31]
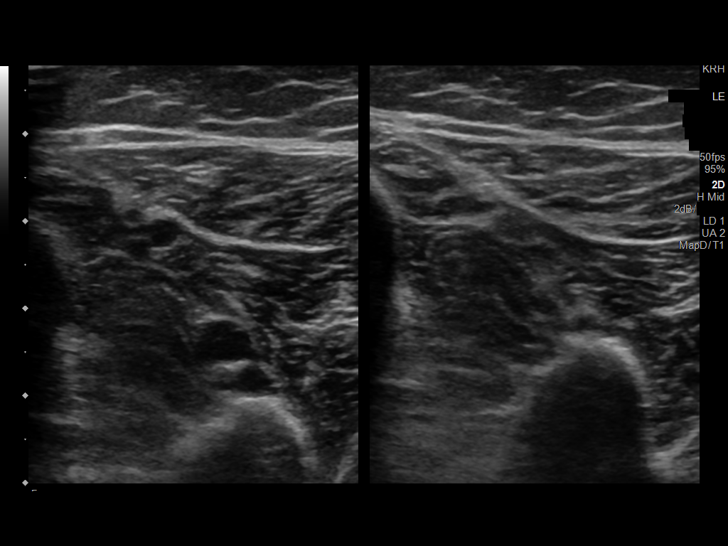
[im 28/31]
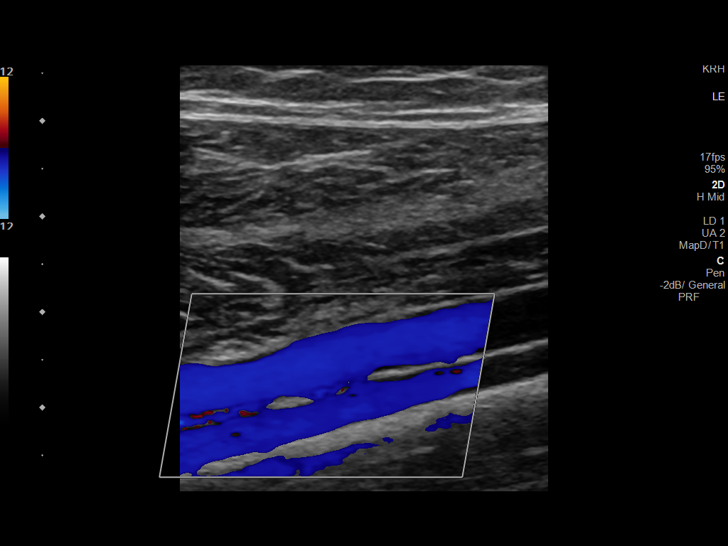
[im 31/31]
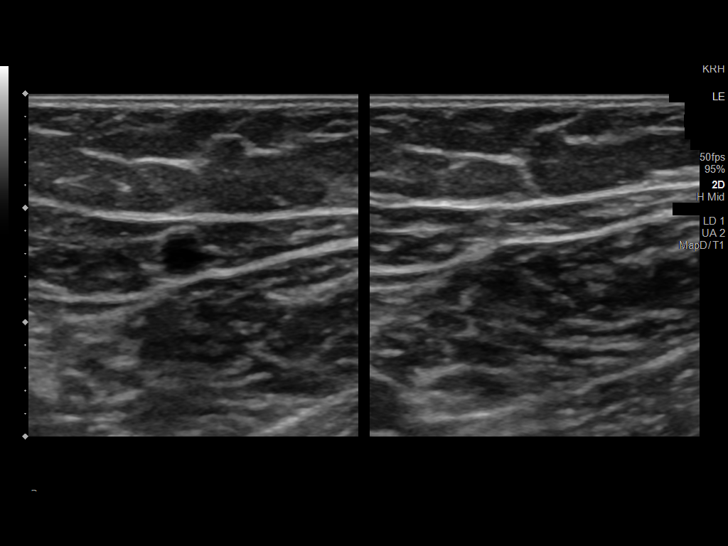

[14 of 24 positions shown; findings below may reference images not displayed]

FINDINGS: VENOUS

Normal compressibility of the common femoral, superficial femoral,
and popliteal veins, as well as the visualized calf veins.
Visualized portions of profunda femoral vein and great saphenous
vein unremarkable. No filling defects to suggest DVT on grayscale or
color Doppler imaging. Doppler waveforms show normal direction of
venous flow, normal respiratory plasticity and response to
augmentation.

Limited views of the contralateral common femoral vein are
unremarkable.

OTHER

None.

Limitations: none
IMPRESSION: No evidence of DVT.

## 2022-04-12 ENCOUNTER — Emergency Department (HOSPITAL_BASED_OUTPATIENT_CLINIC_OR_DEPARTMENT_OTHER)
Admission: EM | Admit: 2022-04-12 | Discharge: 2022-04-12 | Disposition: A | Discharge status: Home / Self Care | Payer: Self-pay | Attending: Emergency Medicine | Admitting: Emergency Medicine

## 2022-04-12 ENCOUNTER — Emergency Department (HOSPITAL_BASED_OUTPATIENT_CLINIC_OR_DEPARTMENT_OTHER): Payer: Self-pay

## 2022-04-12 ENCOUNTER — Encounter (HOSPITAL_BASED_OUTPATIENT_CLINIC_OR_DEPARTMENT_OTHER): Payer: Self-pay | Admitting: Emergency Medicine

## 2022-04-12 DIAGNOSIS — S93509A Unspecified sprain of unspecified toe(s), initial encounter: Secondary | ICD-10-CM

## 2022-04-12 DIAGNOSIS — W19XXXA Unspecified fall, initial encounter: Secondary | ICD-10-CM | POA: Diagnosis not present

## 2022-04-12 DIAGNOSIS — M79675 Pain in left toe(s): Secondary | ICD-10-CM | POA: Diagnosis present

## 2022-04-12 MED ORDER — ACETAMINOPHEN 500 MG PO TABS
1000.0000 mg | ORAL_TABLET | Freq: Once | ORAL | Status: AC
  Administered 2022-04-12: 1000 mg via ORAL
  Filled 2022-04-12: qty 2

## 2022-04-12 NOTE — ED Triage Notes (Signed)
Pt getting ready for work and slipped or tripped and injured left foot.

## 2022-04-12 NOTE — ED Provider Notes (Addendum)
[facility] Provider Note   CSN: [id number] Arrival date & time: 04/12/22  8563     History  Chief Complaint  Patient presents with   Foot Injury    Shelly Webb is a 46 y.o. female.  46 yo F with a chief complaints of left great toe pain.  She was getting ready to go to work this morning and lost her balance and fell.  She has pain mostly when she tries to dorsiflex the left great toe.  She denies any pain at the ankle or the base of the foot.  Denies other injury in the fall.  She did not have pain to the toe before this incident.   Foot Injury      Home Medications Prior to Admission medications   Medication Sig Start Date End Date Taking? Authorizing Provider  calcium carbonate (TUMS - DOSED IN MG ELEMENTAL CALCIUM) 500 MG chewable tablet Chew 1 tablet by mouth daily.    [provider]  famotidine (PEPCID) 20 MG tablet Take 1 tablet (20 mg total) by mouth 2 (two) times daily. 02/18/17   Charlesetta Shanks, MD  ferrous sulfate 325 (65 FE) MG tablet Take 1 tablet (325 mg total) by mouth daily. 02/18/17   Charlesetta Shanks, MD  pantoprazole (PROTONIX) 20 MG tablet Take 1 tablet (20 mg total) by mouth daily. 02/04/15   Mesner, Corene Cornea, MD  ranitidine (ZANTAC) 150 MG tablet Take 150 mg by mouth 2 (two) times daily.    [provider]      Allergies    Penicillins    Review of Systems   Review of Systems  Physical Exam Updated Vital Signs BP (!) 128/93 (BP Location: Left Arm)   Pulse 75   Temp 98.5 F (36.9 C) (Oral)   Resp 18   Ht '5\' 3"'$  (1.6 m)   Wt 71.2 kg   LMP 05/02/2018 (Approximate)   SpO2 98%   BMI 27.81 kg/m  Physical Exam Vitals and nursing note reviewed.  Constitutional:      General: She is not in acute distress.    Appearance: She is well-developed. She is not diaphoretic.  HENT:     Head: Normocephalic and atraumatic.  Eyes:     Pupils: Pupils are equal, round, and reactive to light.   Cardiovascular:     Rate and Rhythm: Normal rate and regular rhythm.     Heart sounds: No murmur heard.    No friction rub. No gallop.  Pulmonary:     Effort: Pulmonary effort is normal.     Breath sounds: No wheezing or rales.  Abdominal:     General: There is no distension.     Palpations: Abdomen is soft.     Tenderness: There is no abdominal tenderness.  Musculoskeletal:        General: Tenderness present.     Cervical back: Normal range of motion and neck supple.     Comments: Patient describes pain mostly on the dorsal aspect of the left great toe at the proximal phalanx.  No edema no erythema.  No pain at the MTP.  No pain at the base of the navicular no pain to the base of the fifth metatarsal.  No pain about the ankle.  Full range of motion of the ankle without discomfort.  Intact pulse motor and sensation.  Skin:    General: Skin is warm and dry.  Neurological:     Mental Status: She is alert and  oriented to person, place, and time.  Psychiatric:        Behavior: Behavior normal.     ED Results / Procedures / Treatments   Labs (all labs ordered are listed, but only abnormal results are displayed) Labs Reviewed - No data to display  EKG None  Radiology No results found.  Procedures Procedures    Medications Ordered in ED Medications - No data to display  ED Course/ Medical Decision Making/ A&P                           Medical Decision Making Amount and/or Complexity of Data Reviewed Radiology: ordered.  Risk OTC drugs.   46 yo F with a chief complaints of left great toe pain.  This occurred when she tripped earlier this morning.  Clinically unlikely to be fractured.  Plain film independently interpreted by me without fracture.  Will place in a postop shoe.  PCP follow-up.  7:28 AM:  I have discussed the diagnosis/risks/treatment options with the patient.  Evaluation and diagnostic testing in the emergency department does not suggest an emergent  condition requiring admission or immediate intervention beyond what has been performed at this time.  They will follow up with PCP. We also discussed returning to the ED immediately if new or worsening sx occur. We discussed the sx which are most concerning (e.g., sudden worsening pain, fever, inability to tolerate by mouth) that necessitate immediate return. Medications administered to the patient during their visit and any new prescriptions provided to the patient are listed below.  Medications given during this visit Medications - No data to display   The patient appears reasonably screen and/or stabilized for discharge and I doubt any other medical condition or other [facility] requiring further screening, evaluation, or treatment in the ED at this time prior to discharge.          Final Clinical Impression(s) / ED Diagnoses Final diagnoses:  Great toe pain, left    Rx / DC Orders ED Discharge Orders     None         Deno Etienne, DO 04/12/22 [place], [place], DO 04/12/22 [phone]

## 2022-04-12 NOTE — Discharge Instructions (Signed)
Your toe was not broken on x-ray imaging.  Typically we do have you in a postop shoe to try and keep your toe from bending as best we could.  If you are having trouble tolerating this then buddy taping is the next best thing.  Take 4 over the counter ibuprofen tablets 3 times a day or 2 over-the-counter naproxen tablets twice a day for pain. Also take tylenol '1000mg'$ (2 extra strength) four times a day.

## 2023-05-07 ENCOUNTER — Emergency Department (HOSPITAL_BASED_OUTPATIENT_CLINIC_OR_DEPARTMENT_OTHER)
Admission: EM | Admit: 2023-05-07 | Discharge: 2023-05-07 | Disposition: A | Discharge status: Home / Self Care | Payer: Self-pay | Attending: Emergency Medicine | Admitting: Emergency Medicine

## 2023-05-07 ENCOUNTER — Encounter (HOSPITAL_BASED_OUTPATIENT_CLINIC_OR_DEPARTMENT_OTHER): Payer: Self-pay | Admitting: Emergency Medicine

## 2023-05-07 ENCOUNTER — Emergency Department (HOSPITAL_BASED_OUTPATIENT_CLINIC_OR_DEPARTMENT_OTHER): Payer: Self-pay

## 2023-05-07 ENCOUNTER — Other Ambulatory Visit: Payer: Self-pay

## 2023-05-07 DIAGNOSIS — J45909 Unspecified asthma, uncomplicated: Secondary | ICD-10-CM | POA: Insufficient documentation

## 2023-05-07 DIAGNOSIS — Z853 Personal history of malignant neoplasm of breast: Secondary | ICD-10-CM | POA: Insufficient documentation

## 2023-05-07 DIAGNOSIS — R0602 Shortness of breath: Secondary | ICD-10-CM | POA: Insufficient documentation

## 2023-05-07 LAB — COMPREHENSIVE METABOLIC PANEL
ALT: 22 U/L (ref 0–44)
AST: 23 U/L (ref 15–41)
Albumin: 4.4 g/dL (ref 3.5–5.0)
Alkaline Phosphatase: 66 U/L (ref 38–126)
Anion gap: 7 (ref 5–15)
BUN: 11 mg/dL (ref 6–20)
CO2: 23 mmol/L (ref 22–32)
Calcium: 9.5 mg/dL (ref 8.9–10.3)
Chloride: 107 mmol/L (ref 98–111)
Creatinine, Ser: 0.94 mg/dL (ref 0.44–1.00)
GFR, Estimated: >60 mL/min (ref >60)
Glucose, Bld: 103 mg/dL — ABNORMAL HIGH (ref 70–99)
Potassium: 3.8 mmol/L (ref 3.5–5.1)
Sodium: 137 mmol/L (ref 135–145)
Total Bilirubin: 0.9 mg/dL (ref <1.2)
Total Protein: 7.4 g/dL (ref 6.5–8.1)

## 2023-05-07 LAB — CBC WITH DIFFERENTIAL/PLATELET
Abs Immature Granulocytes: 0 10*3/uL (ref 0.00–0.07)
Basophils Absolute: 0 10*3/uL (ref 0.0–0.1)
Basophils Relative: 0 %
Eosinophils Absolute: 0.1 10*3/uL (ref 0.0–0.5)
Eosinophils Relative: 2 %
HCT: 35.6 % — ABNORMAL LOW (ref 36.0–46.0)
Hemoglobin: 12.1 g/dL (ref 12.0–15.0)
Immature Granulocytes: 0 %
Lymphocytes Relative: 34 %
Lymphs Abs: 1.6 10*3/uL (ref 0.7–4.0)
MCH: 29.8 pg (ref 26.0–34.0)
MCHC: 34 g/dL (ref 30.0–36.0)
MCV: 87.7 fL (ref 80.0–100.0)
Monocytes Absolute: 0.2 10*3/uL (ref 0.1–1.0)
Monocytes Relative: 5 %
Neutro Abs: 2.8 10*3/uL (ref 1.7–7.7)
Neutrophils Relative %: 59 %
Platelets: 279 10*3/uL (ref 150–400)
RBC: 4.06 MIL/uL (ref 3.87–5.11)
RDW: 12.7 % (ref 11.5–15.5)
WBC: 4.8 10*3/uL (ref 4.0–10.5)
nRBC: 0 % (ref 0.0–0.2)

## 2023-05-07 LAB — TROPONIN I (HIGH SENSITIVITY): Troponin I (High Sensitivity): 2 ng/L (ref <18)

## 2023-05-07 LAB — BRAIN NATRIURETIC PEPTIDE: B Natriuretic Peptide: 32.2 pg/mL (ref 0.0–100.0)

## 2023-05-07 NOTE — ED Provider Notes (Signed)
[facility] Provider Note   CSN: [id number] Arrival date & time: 05/07/23  6213     History  Chief Complaint  Patient presents with   Shortness of Breath    Shelly Webb is a 47 y.o. female with history of breast cancer in remission presents with complaints of shortness of breath.  Patient reports that she will feel intermittently short of breath.  These episodes may last up to 5 minutes.  She seems to have noticed the symptoms more over the past 2 weeks.  They occur at rest and with exertion.  States she used an inhaler when she was younger for asthma but has not had no difficulties over the past several years.  Denies any associated chest pain.  No history of blood clot.  No lower extremity pain or swelling.  She is not on birth control.  No recent surgery or extended travel.  She is not no cough or congestion.  No one is sick at home.  She is unsure whether her symptoms are associated with anxiety and stress that she recently lost her best friend.   Shortness of Breath     Past Medical History:  Diagnosis Date   Asthma    Cancer ([facility])      Home Medications Prior to Admission medications   Medication Sig Start Date End Date Taking? Authorizing Provider  calcium carbonate (TUMS - DOSED IN MG ELEMENTAL CALCIUM) 500 MG chewable tablet Chew 1 tablet by mouth daily.    [provider]  famotidine (PEPCID) 20 MG tablet Take 1 tablet (20 mg total) by mouth 2 (two) times daily. 02/18/17   Arby Barrette, MD  ferrous sulfate 325 (65 FE) MG tablet Take 1 tablet (325 mg total) by mouth daily. 02/18/17   Arby Barrette, MD  pantoprazole (PROTONIX) 20 MG tablet Take 1 tablet (20 mg total) by mouth daily. 02/04/15   Mesner, Barbara Cower, MD  ranitidine (ZANTAC) 150 MG tablet Take 150 mg by mouth 2 (two) times daily.    [provider]      Allergies    Penicillins    Review of Systems   Review of Systems  Respiratory:   Positive for shortness of breath.     Physical Exam Updated Vital Signs BP 126/69 (BP Location: Right Arm)   Pulse 80   Temp 98.2 F (36.8 C)   Resp 18   Ht 5\' 3"  (1.6 m)   Wt 72.1 kg   LMP 05/02/2018 (Approximate)   SpO2 98%   BMI 28.17 kg/m  Physical Exam Vitals and nursing note reviewed.  Constitutional:      General: She is not in acute distress.    Appearance: She is well-developed.  HENT:     Head: Normocephalic and atraumatic.  Eyes:     Conjunctiva/sclera: Conjunctivae normal.  Cardiovascular:     Rate and Rhythm: Normal rate and regular rhythm.     Heart sounds: No murmur heard. Pulmonary:     Effort: Pulmonary effort is normal. No respiratory distress.     Breath sounds: Normal breath sounds.  Abdominal:     Palpations: Abdomen is soft.     Tenderness: There is no abdominal tenderness.  Musculoskeletal:        General: No swelling.     Cervical back: Neck supple.     Right lower leg: No tenderness. No edema.     Left lower leg: No tenderness. No edema.  Skin:  General: Skin is warm and dry.     Capillary Refill: Capillary refill takes less than 2 seconds.  Neurological:     Mental Status: She is alert.  Psychiatric:        Mood and Affect: Mood normal.     ED Results / Procedures / Treatments   Labs (all labs ordered are listed, but only abnormal results are displayed) Labs Reviewed  CBC WITH DIFFERENTIAL/PLATELET - Abnormal; Notable for the following components:      Result Value   HCT 35.6 (*)    All other components within normal limits  COMPREHENSIVE METABOLIC PANEL - Abnormal; Notable for the following components:   Glucose, Bld 103 (*)    All other components within normal limits  BRAIN NATRIURETIC PEPTIDE  TROPONIN I (HIGH SENSITIVITY)    EKG EKG Interpretation Date/Time:  Monday May 07 2023 09:06:57 EST Ventricular Rate:  68 PR Interval:  132 QRS Duration:  87 QT Interval:  380 QTC Calculation: 405 R Axis:   84  Text  Interpretation: Sinus rhythm Right atrial enlargement Minimal ST depression, inferior leads No significant change since prior 9/18 Confirmed by Meridee Score [phone]) on 05/07/2023 9:09:58 AM  Radiology DG Chest 2 View Result Date: 05/07/2023 CLINICAL DATA:  Shortness of breath. EXAM: CHEST - 2 VIEW COMPARISON:  Chest radiograph dated January 13, 2021. FINDINGS: The heart size and mediastinal contours are within normal limits. Both lungs are clear. No pleural effusion or pneumothorax. No acute osseous abnormality. IMPRESSION: No active cardiopulmonary disease. Electronically Signed   By: Hart Robinsons M.D.   On: 05/07/2023 09:48    Procedures Procedures    Medications Ordered in ED Medications - No data to display  ED Course/ Medical Decision Making/ A&P                                 Medical Decision Making  This patient presents to the ED with chief complaint(s) of shortness of breath.  The complaint involves an extensive differential diagnosis and also carries with it a high risk of complications and morbidity.   pertinent past medical history as listed in HPI  The differential diagnosis includes  ACS, PE, asthma, URI The initial plan is to  Will obtain basic labs, EKG, chest x-ray Additional history obtained: No additional historians Records reviewed previous admission documents  Initial Assessment:   Patient is hemodynamically stable, nontoxic-appearing presenting with 4 years of intermittent shortness of breath that she appears of noticed more over the past 2 weeks.  Symptoms occur at rest and with exertion.  Symptoms come and go and there appears to be no consistent pattern.  Overall low suspicion for ACS, PE.  She does report history of asthma as a child but has had no treatment in the past few years.  Today her lung sounds are clear and she is currently entirely asymptomatic.  She has no cough or congestion to suggest URI.  Independent ECG interpretation:  Sinus rhythm  without ischemic changes  Independent labs interpretation:  The following labs were independently interpreted:  CBC, CMP without significant abnormality, BMP within normal limits, troponin without elevation  Independent visualization and interpretation of imaging: I independently visualized the following imaging with scope of interpretation limited to determining acute life threatening conditions related to emergency care: Chest x-ray, which revealed no cardiopulmonary disease  Treatment and Reassessment: No medications administered during visit  11:50 AM patient remains asymptomatic, discussed  negative workup with patient as well as discharge plan.  She is agreeable.  Consultations obtained:   None  Disposition:   Patient will be discharged home, encourage close PCP follow-up. The patient has been appropriately medically screened and/or stabilized in the ED. I have low suspicion for any other emergent medical condition which would require further screening, evaluation or treatment in the ED or require inpatient management. At time of discharge the patient is hemodynamically stable and in no acute distress. I have discussed work-up results and diagnosis with patient and answered all questions. Patient is agreeable with discharge plan. We discussed strict return precautions for returning to the emergency department and they verbalized understanding.     Social Determinants of Health:   None  This note was dictated with voice recognition software.  Despite best efforts at proofreading, errors may have occurred which can change the documentation meaning.          Final Clinical Impression(s) / ED Diagnoses Final diagnoses:  SOB (shortness of breath)    Rx / DC Orders ED Discharge Orders     None         Fabienne Bruns 05/07/23 1156    Terrilee Files, MD 05/07/23 [phone]

## 2023-05-07 NOTE — Discharge Instructions (Addendum)
It was a pleasure taking care of you today.  You were evaluated in the emergency room for shortness of breath.  Your chest x-ray, EKG and lab work were all normal.  I would recommend you follow-up with your primary care doctor within the next 5 days to further your care.  Should you experience any new or worsening symptoms including chest pain persistent shortness of breath, dizziness please return to the emergency room.

## 2023-05-07 NOTE — ED Triage Notes (Signed)
Sob x a couple of weeks  denies cp of n/v has been upder stress , sob with  exertion she reports

## 2024-02-02 ENCOUNTER — Emergency Department (HOSPITAL_BASED_OUTPATIENT_CLINIC_OR_DEPARTMENT_OTHER): Payer: Self-pay

## 2024-02-02 ENCOUNTER — Emergency Department (HOSPITAL_BASED_OUTPATIENT_CLINIC_OR_DEPARTMENT_OTHER)
Admission: EM | Admit: 2024-02-02 | Discharge: 2024-02-02 | Disposition: A | Discharge status: Home / Self Care | Payer: Self-pay | Attending: Emergency Medicine | Admitting: Emergency Medicine

## 2024-02-02 ENCOUNTER — Encounter (HOSPITAL_BASED_OUTPATIENT_CLINIC_OR_DEPARTMENT_OTHER): Payer: Self-pay | Admitting: Emergency Medicine

## 2024-02-02 DIAGNOSIS — Z853 Personal history of malignant neoplasm of breast: Secondary | ICD-10-CM | POA: Insufficient documentation

## 2024-02-02 DIAGNOSIS — R1030 Lower abdominal pain, unspecified: Secondary | ICD-10-CM | POA: Insufficient documentation

## 2024-02-02 LAB — CBC WITH DIFFERENTIAL/PLATELET
Abs Immature Granulocytes: 0.01 K/uL (ref 0.00–0.07)
Basophils Absolute: 0 K/uL (ref 0.0–0.1)
Basophils Relative: 0 %
Eosinophils Absolute: 0.1 K/uL (ref 0.0–0.5)
Eosinophils Relative: 1 %
HCT: 38.5 % (ref 36.0–46.0)
Hemoglobin: 13.2 g/dL (ref 12.0–15.0)
Immature Granulocytes: 0 %
Lymphocytes Relative: 24 %
Lymphs Abs: 1.6 K/uL (ref 0.7–4.0)
MCH: 30.2 pg (ref 26.0–34.0)
MCHC: 34.3 g/dL (ref 30.0–36.0)
MCV: 88.1 fL (ref 80.0–100.0)
Monocytes Absolute: 0.4 K/uL (ref 0.1–1.0)
Monocytes Relative: 5 %
Neutro Abs: 4.7 K/uL (ref 1.7–7.7)
Neutrophils Relative %: 70 %
Platelets: 283 K/uL (ref 150–400)
RBC: 4.37 MIL/uL (ref 3.87–5.11)
RDW: 12.6 % (ref 11.5–15.5)
WBC: 6.8 K/uL (ref 4.0–10.5)
nRBC: 0 % (ref 0.0–0.2)

## 2024-02-02 LAB — COMPREHENSIVE METABOLIC PANEL WITH GFR
ALT: 19 U/L (ref 0–44)
AST: 19 U/L (ref 15–41)
Albumin: 4.8 g/dL (ref 3.5–5.0)
Alkaline Phosphatase: 77 U/L (ref 38–126)
Anion gap: 12 (ref 5–15)
BUN: 9 mg/dL (ref 6–20)
CO2: 25 mmol/L (ref 22–32)
Calcium: 11 mg/dL — ABNORMAL HIGH (ref 8.9–10.3)
Chloride: 105 mmol/L (ref 98–111)
Creatinine, Ser: 0.87 mg/dL (ref 0.44–1.00)
GFR, Estimated: >60 mL/min (ref >60)
Glucose, Bld: 98 mg/dL (ref 70–99)
Potassium: 4.4 mmol/L (ref 3.5–5.1)
Sodium: 142 mmol/L (ref 135–145)
Total Bilirubin: 0.7 mg/dL (ref 0.0–1.2)
Total Protein: 7.8 g/dL (ref 6.5–8.1)

## 2024-02-02 LAB — URINALYSIS, ROUTINE W REFLEX MICROSCOPIC
Bilirubin Urine: NEGATIVE
Glucose, UA: NEGATIVE mg/dL
Ketones, ur: NEGATIVE mg/dL
Leukocytes,Ua: NEGATIVE
Nitrite: NEGATIVE
Protein, ur: NEGATIVE mg/dL
Specific Gravity, Urine: 1.015 (ref 1.005–1.030)
pH: 7 (ref 5.0–8.0)

## 2024-02-02 LAB — URINALYSIS, MICROSCOPIC (REFLEX): Squamous Epithelial / HPF: NONE SEEN /HPF (ref 0–5)

## 2024-02-02 LAB — LIPASE, BLOOD: Lipase: 20 U/L (ref 11–51)

## 2024-02-02 MED ORDER — IOHEXOL 300 MG/ML  SOLN
100.0000 mL | Freq: Once | INTRAMUSCULAR | Status: AC | PRN
  Administered 2024-02-02: 100 mL via INTRAVENOUS

## 2024-02-02 NOTE — ED Provider Notes (Signed)
 [facility] Provider Note   CSN: [id number] Arrival date & time: 02/02/24  0945     Patient presents with: Abdominal Pain   Shelly Webb is a 48 y.o. female.   48 y.o female with a PMH of Breast CA presents to the ED with a chief complaint  abdominal pain 3-4 weeks ago. Patient describes this as a butterfly feeling to her lower abdomen which occurred every morning.  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  She states that she woke up the next morning and also felt that her stomach was still hurting.  She states she had a CT scan in April 2 further assess her condition as she is a cancer survivor.  States that the CT scan was normal then.  She tells me I feel like if I wait I would worry myself, but blood pressure has been high because I been worry sick .  There is no alleviating or exacerbating factors.  Last oral intake was yesterday. Abdominal pain felt like cramping sensation across the entire abdomen. No fever, no nausea, no vomiting, or urinary symptoms.   The history is provided by the patient.  Abdominal Pain Pain location:  Suprapubic Pain quality: aching   Pain radiates to:  Does not radiate Pain severity:  Mild Onset quality:  Gradual Duration:  4 weeks Timing:  Constant Progression:  Waxing and waning Chronicity:  New Associated symptoms: constipation   Associated symptoms: no chest pain, no chills, no diarrhea, no fever, no nausea, no shortness of breath, no sore throat and no vomiting        Prior to Admission medications   Medication Sig Start Date End Date Taking? Authorizing Provider  calcium carbonate (TUMS - DOSED IN MG ELEMENTAL CALCIUM) 500 MG chewable tablet Chew 1 tablet by mouth daily.     [provider]  famotidine  (PEPCID ) 20 MG tablet Take 1 tablet (20 mg total) by mouth 2 (two) times daily. 02/18/17   Armenta Canning, MD  ferrous sulfate  325 (65 FE) MG tablet Take 1 tablet (325 mg total) by mouth daily. 02/18/17   Armenta Canning, MD  pantoprazole  (PROTONIX ) 20 MG tablet Take 1 tablet (20 mg total) by mouth daily. 02/04/15   Mesner, Jason, MD  ranitidine (ZANTAC) 150 MG tablet Take 150 mg by mouth 2 (two) times daily.    [provider]    Allergies: Penicillins    Review of Systems  Constitutional:  Negative for chills and fever.  HENT:  Negative for sore throat.   Respiratory:  Negative for shortness of breath.   Cardiovascular:  Negative for chest pain.  Gastrointestinal:  Positive for abdominal pain and constipation. Negative for blood in stool, diarrhea, nausea and vomiting.  Genitourinary:  Negative for flank pain.  Musculoskeletal:  Negative for back pain.  Skin:  Negative for pallor and wound.  All other systems reviewed and are negative.   Updated Vital Signs BP (!) 157/93 (BP Location: Right Arm)   Pulse 76   Temp 98.2 F (36.8 C) (Oral)   Resp 18   Ht 5' 3 (1.6 m)   Wt 69.4 kg   LMP 05/02/2018 (Approximate)   SpO2 100%   BMI 27.10 kg/m  Physical Exam Vitals reviewed.  Constitutional:      Appearance: She is well-developed.  HENT:     Head: Normocephalic and atraumatic.  Cardiovascular:     Rate and Rhythm: Normal rate.  Pulmonary:     Effort: Pulmonary effort is normal.  Abdominal:     General: Abdomen is flat. A surgical scar is present. Bowel sounds are decreased. There is distension.     Palpations: Abdomen is soft.     Tenderness: There is abdominal tenderness in the suprapubic area.  Skin:    General: Skin is warm and dry.  Neurological:     Mental Status: She is alert and oriented to person, place, and time.     (all labs ordered are listed, but only abnormal results are displayed) Labs Reviewed   URINALYSIS, ROUTINE W REFLEX MICROSCOPIC - Abnormal; Notable for the following components:      Result Value   Hgb urine dipstick MODERATE (*)    All other components within normal limits  COMPREHENSIVE METABOLIC PANEL WITH GFR - Abnormal; Notable for the following components:   Calcium 11.0 (*)    All other components within normal limits  URINALYSIS, MICROSCOPIC (REFLEX) - Abnormal; Notable for the following components:   Bacteria, UA RARE (*)    All other components within normal limits  CBC WITH DIFFERENTIAL/PLATELET  LIPASE, BLOOD    EKG: None  Radiology: CT ABDOMEN PELVIS W CONTRAST Result Date: 02/02/2024 CLINICAL DATA:  Lower abdominal pain ongoing for last 3-4 weeks EXAM: CT ABDOMEN AND PELVIS WITH CONTRAST TECHNIQUE: Multidetector CT imaging of the abdomen and pelvis was performed using the standard protocol following bolus administration of intravenous contrast. RADIATION DOSE REDUCTION: This exam was performed according to the departmental dose-optimization program which includes automated exposure control, adjustment of the mA and/or kV according to patient size and/or use of iterative reconstruction technique. CONTRAST:  OMNIPAQUE  IOHEXOL  300 MG/ML  SOLN COMPARISON:  08/24/2023 FINDINGS: Lower chest: Small type 1 hiatal hernia. Hepatobiliary: Unremarkable Pancreas: Unremarkable Spleen: Unremarkable Adrenals/Urinary Tract: Unremarkable Stomach/Bowel: Unremarkable.  Normal appendix. Vascular/Lymphatic: Unremarkable Reproductive: IUD satisfactorily positioned along the endometrium. No significant abnormality. Other: No supplemental non-categorized findings. Musculoskeletal: Unremarkable IMPRESSION: 1. A specific cause for the patient's lower abdominal pain is not identified. 2. Small type 1 hiatal hernia. Electronically Signed   By: Ryan Salvage M.D.   On: 02/02/2024 12:49     Procedures   Medications Ordered in the ED  iohexol  (OMNIPAQUE ) 300 MG/ML solution 100 mL  (100 mLs Intravenous Contrast Given 02/02/24 1201)                                    Medical Decision Making Amount and/or Complexity of Data Reviewed Labs: ordered. Radiology: ordered.  Risk Prescription drug management.   This patient presents to the ED for concern of abdominal pain , this involves a number of treatment options, and is a complaint that carries with it a high risk of complications and morbidity.  The differential diagnosis includes obstruction, constipation versus viral illness.    Co morbidities: Discussed in HPI   Brief History:  See HPI.   EMR reviewed including pt PMHx, past surgical history and past visits to ER.   See HPI for more details   Lab Tests:  I ordered and independently interpreted labs.  The pertinent results include:    I personally reviewed all laboratory work and imaging. Metabolic  panel without any acute abnormality specifically kidney function within normal limits and no significant electrolyte abnormalities. CBC without leukocytosis or significant anemia. UA with moderate blood, no nitrites or leukocytes noted.  Imaging Studies:  CT Abdomen and pelvis ordered:    Medicines ordered:  N/A  Reevaluation:  After the interventions noted above I re-evaluated patient and found that they have :improved   Social Determinants of Health:  The patient's social determinants of health were a factor in the care of this patient   Problem List / ED Course:  Patient with a prior history of breast cancer on remission for years ago.  She tells me she has had abdominal pain for the past 3 to 4 weeks, she feels somewhat bloated, abdominal pain is starts when she wakes up and is constant along her lower abdomen.  She has had a bowel movement a few days ago after taking some magnesium citrate but she noted herself for being constipated.  She tells me that she did get a prior CT scan in the month of July.  According to records she did have a  normal CT chest abdomen and pelvis then.  She is concerned that she does not want any return of pathology. Interpretation of her blood reveals CBC with no leukocytosis, hemoglobin is within normal limits.  CMP with no electrolyte derangement, creatinine levels unremarkable.  LFTs are within normal limits.  Lipase levels normal.  She does endorse some nausea but has not had any episodes of vomiting.  Last oral intake was last night.  Urinalysis does have some blood present and rare bacteria but she is not having any urinary symptoms at this time.  Patient does appear very worked up about obtaining a CT scan at this time.  However, seen with her prior medical history I do feel that this is warranted at this time. Her abdominal exam is nonfocal, she does not have any focal tenderness at this time, bowel sounds are somewhat decreased.  There is a prior surgical scar present from a previous C-section.  Will obtain CT abdomen and pelvis for further evaluation. CT abdomen and pelvis without any acute abnormalities today.  Will have patient follow-up in an outpatient as were her PCP.  Hemodynamically stable for discharge.   Dispostion:  After consideration of the diagnostic results and the patients response to treatment, I feel that the patent would benefit from close follow-up with PCP.   Portions of this note were generated with Scientist, clinical (histocompatibility and immunogenetics). Dictation errors may occur despite best attempts at proofreading.   Final diagnoses:  Lower abdominal pain    ED Discharge Orders     None          Maureen Broad, PA-C 02/02/24 1311    Dreama Longs, MD 02/03/24 [phone]

## 2024-02-02 NOTE — ED Triage Notes (Signed)
 Lower abdominal pain ongoing x 3 - 4 weeks. Started as intermittent, became constant yesterday. Drank Mag citrate with + BM Thursday. Denies bloody stool. Denies n/v, fevers, urinary sx, vaginal sx.

## 2024-02-02 NOTE — ED Notes (Signed)
 ED Provider at bedside.

## 2024-02-02 NOTE — ED Notes (Signed)
 Dc instructions given, pt verbalized understanding. Out of ED with steady gait, all belongings not in visible distress.

## 2024-02-02 NOTE — ED Notes (Signed)
 Patient transported to CT

## 2024-02-02 NOTE — Discharge Instructions (Addendum)
 Your laboratories alts are within normal limits today.  Please follow-up with your primary care physician as needed.
